# Patient Record
Sex: Male | Born: 1955 | Race: Black or African American | Hispanic: No | Marital: Single | Smoking: Current every day smoker
Health system: Southern US, Community
[De-identification: ages and names within clinical notes are randomized; demographics above are authoritative.]

## PROBLEM LIST (undated history)

## (undated) DIAGNOSIS — I2510 Atherosclerotic heart disease of native coronary artery without angina pectoris: Secondary | ICD-10-CM

---

## 2001-10-23 ENCOUNTER — Emergency Department (HOSPITAL_COMMUNITY): Admission: EM | Admit: 2001-10-23 | Discharge: 2001-10-23 | Payer: Self-pay | Admitting: Emergency Medicine

## 2001-10-24 ENCOUNTER — Encounter: Payer: Self-pay | Admitting: Emergency Medicine

## 2009-04-11 ENCOUNTER — Encounter: Admission: RE | Admit: 2009-04-11 | Discharge: 2009-04-11 | Payer: Self-pay | Admitting: Neurosurgery

## 2009-06-07 ENCOUNTER — Ambulatory Visit (HOSPITAL_COMMUNITY): Admission: RE | Admit: 2009-06-07 | Discharge: 2009-06-07 | Payer: Self-pay | Admitting: Neurosurgery

## 2009-07-30 ENCOUNTER — Encounter: Admission: RE | Admit: 2009-07-30 | Discharge: 2009-07-30 | Payer: Self-pay | Admitting: Neurosurgery

## 2009-10-11 ENCOUNTER — Ambulatory Visit (HOSPITAL_COMMUNITY): Admission: RE | Admit: 2009-10-11 | Discharge: 2009-10-11 | Payer: Self-pay | Admitting: Neurosurgery

## 2009-12-28 ENCOUNTER — Encounter
Admission: RE | Admit: 2009-12-28 | Discharge: 2010-01-03 | Payer: Self-pay | Source: Home / Self Care | Attending: Neurosurgery | Admitting: Neurosurgery

## 2010-01-03 ENCOUNTER — Encounter
Admission: RE | Admit: 2010-01-03 | Discharge: 2010-01-17 | Payer: Self-pay | Source: Home / Self Care | Attending: Neurosurgery | Admitting: Neurosurgery

## 2010-01-04 ENCOUNTER — Ambulatory Visit (HOSPITAL_COMMUNITY)
Admission: RE | Admit: 2010-01-04 | Discharge: 2010-01-04 | Payer: Self-pay | Source: Home / Self Care | Attending: Neurosurgery | Admitting: Neurosurgery

## 2010-01-17 ENCOUNTER — Encounter: Admit: 2010-01-17 | Payer: Self-pay | Admitting: Neurosurgery

## 2010-01-23 ENCOUNTER — Encounter: Payer: Self-pay | Admitting: Neurosurgery

## 2010-03-11 ENCOUNTER — Emergency Department (HOSPITAL_COMMUNITY): Payer: Self-pay

## 2010-03-11 ENCOUNTER — Emergency Department (HOSPITAL_COMMUNITY)
Admission: EM | Admit: 2010-03-11 | Discharge: 2010-03-11 | Disposition: A | Discharge status: Home / Self Care | Payer: Self-pay | Attending: Emergency Medicine | Admitting: Emergency Medicine

## 2010-03-11 DIAGNOSIS — Z7982 Long term (current) use of aspirin: Secondary | ICD-10-CM | POA: Insufficient documentation

## 2010-03-11 DIAGNOSIS — R1032 Left lower quadrant pain: Secondary | ICD-10-CM | POA: Insufficient documentation

## 2010-03-11 DIAGNOSIS — IMO0002 Reserved for concepts with insufficient information to code with codable children: Secondary | ICD-10-CM | POA: Insufficient documentation

## 2010-03-11 DIAGNOSIS — X58XXXA Exposure to other specified factors, initial encounter: Secondary | ICD-10-CM | POA: Insufficient documentation

## 2010-03-11 DIAGNOSIS — I10 Essential (primary) hypertension: Secondary | ICD-10-CM | POA: Insufficient documentation

## 2010-03-11 DIAGNOSIS — R071 Chest pain on breathing: Secondary | ICD-10-CM | POA: Insufficient documentation

## 2010-03-11 DIAGNOSIS — E7800 Pure hypercholesterolemia, unspecified: Secondary | ICD-10-CM | POA: Insufficient documentation

## 2010-03-11 DIAGNOSIS — I252 Old myocardial infarction: Secondary | ICD-10-CM | POA: Insufficient documentation

## 2010-03-11 DIAGNOSIS — K219 Gastro-esophageal reflux disease without esophagitis: Secondary | ICD-10-CM | POA: Insufficient documentation

## 2010-03-11 DIAGNOSIS — I2510 Atherosclerotic heart disease of native coronary artery without angina pectoris: Secondary | ICD-10-CM | POA: Insufficient documentation

## 2010-03-11 DIAGNOSIS — Z79899 Other long term (current) drug therapy: Secondary | ICD-10-CM | POA: Insufficient documentation

## 2010-03-11 LAB — COMPREHENSIVE METABOLIC PANEL
ALT: 11 U/L (ref 0–53)
AST: 13 U/L (ref 0–37)
BUN: 14 mg/dL (ref 6–23)
Chloride: 103 mEq/L (ref 96–112)
GFR calc Af Amer: >60 mL/min (ref >60)
Glucose, Bld: 119 mg/dL — ABNORMAL HIGH (ref 70–99)
Sodium: 136 mEq/L (ref 135–145)
Total Bilirubin: 0.6 mg/dL (ref 0.3–1.2)

## 2010-03-11 LAB — CBC
HCT: 39.6 % (ref 39.0–52.0)
Hemoglobin: 14 g/dL (ref 13.0–17.0)
MCV: 85.7 fL (ref 78.0–100.0)
Platelets: 267 10*3/uL (ref 150–400)
RBC: 4.62 MIL/uL (ref 4.22–5.81)
RDW: 13.6 % (ref 11.5–15.5)

## 2010-03-11 LAB — URINALYSIS, ROUTINE W REFLEX MICROSCOPIC
Hgb urine dipstick: NEGATIVE
Protein, ur: NEGATIVE mg/dL
Urobilinogen, UA: 1 mg/dL (ref 0.0–1.0)

## 2010-03-11 LAB — DIFFERENTIAL
Basophils Relative: 1 % (ref 0–1)
Eosinophils Relative: 3 % (ref 0–5)
Lymphocytes Relative: 47 % — ABNORMAL HIGH (ref 12–46)
Monocytes Absolute: 0.7 10*3/uL (ref 0.1–1.0)

## 2010-03-11 LAB — POCT CARDIAC MARKERS: Myoglobin, poc: 33.5 ng/mL (ref 12–200)

## 2010-03-11 LAB — PROTIME-INR: Prothrombin Time: 13.4 seconds (ref 11.6–15.2)

## 2010-03-21 LAB — TYPE AND SCREEN: ABO/RH(D): O POS

## 2010-03-21 LAB — DIFFERENTIAL
Basophils Absolute: 0 10*3/uL (ref 0.0–0.1)
Eosinophils Relative: 2 % (ref 0–5)
Lymphs Abs: 2.9 10*3/uL (ref 0.7–4.0)
Monocytes Absolute: 0.6 10*3/uL (ref 0.1–1.0)
Neutro Abs: 4 10*3/uL (ref 1.7–7.7)
Neutrophils Relative %: 52 % (ref 43–77)

## 2010-03-21 LAB — CBC
HCT: 40.8 % (ref 39.0–52.0)
Hemoglobin: 13.9 g/dL (ref 13.0–17.0)
MCHC: 34 g/dL (ref 30.0–36.0)
RBC: 4.41 MIL/uL (ref 4.22–5.81)
WBC: 7.6 10*3/uL (ref 4.0–10.5)

## 2010-03-21 LAB — ABO/RH: ABO/RH(D): O POS

## 2010-03-21 LAB — BASIC METABOLIC PANEL
CO2: 27 mEq/L (ref 19–32)
Calcium: 9.8 mg/dL (ref 8.4–10.5)
Chloride: 104 mEq/L (ref 96–112)
Creatinine, Ser: 0.97 mg/dL (ref 0.4–1.5)
Potassium: 4.8 mEq/L (ref 3.5–5.1)
Sodium: 136 mEq/L (ref 135–145)

## 2010-03-21 LAB — SURGICAL PCR SCREEN: MRSA, PCR: NEGATIVE

## 2012-12-17 ENCOUNTER — Emergency Department (HOSPITAL_COMMUNITY): Payer: Self-pay

## 2012-12-17 ENCOUNTER — Emergency Department (HOSPITAL_COMMUNITY)
Admission: EM | Admit: 2012-12-17 | Discharge: 2012-12-17 | Disposition: A | Discharge status: Home / Self Care | Payer: Self-pay | Attending: Emergency Medicine | Admitting: Emergency Medicine

## 2012-12-17 ENCOUNTER — Encounter (HOSPITAL_COMMUNITY): Payer: Self-pay | Admitting: Emergency Medicine

## 2012-12-17 DIAGNOSIS — Z79899 Other long term (current) drug therapy: Secondary | ICD-10-CM | POA: Insufficient documentation

## 2012-12-17 DIAGNOSIS — I252 Old myocardial infarction: Secondary | ICD-10-CM | POA: Insufficient documentation

## 2012-12-17 DIAGNOSIS — F17200 Nicotine dependence, unspecified, uncomplicated: Secondary | ICD-10-CM | POA: Insufficient documentation

## 2012-12-17 DIAGNOSIS — I10 Essential (primary) hypertension: Secondary | ICD-10-CM | POA: Insufficient documentation

## 2012-12-17 DIAGNOSIS — Z7982 Long term (current) use of aspirin: Secondary | ICD-10-CM | POA: Insufficient documentation

## 2012-12-17 DIAGNOSIS — R9431 Abnormal electrocardiogram [ECG] [EKG]: Secondary | ICD-10-CM | POA: Insufficient documentation

## 2012-12-17 DIAGNOSIS — I2510 Atherosclerotic heart disease of native coronary artery without angina pectoris: Secondary | ICD-10-CM | POA: Insufficient documentation

## 2012-12-17 DIAGNOSIS — Z7902 Long term (current) use of antithrombotics/antiplatelets: Secondary | ICD-10-CM | POA: Insufficient documentation

## 2012-12-17 DIAGNOSIS — E785 Hyperlipidemia, unspecified: Secondary | ICD-10-CM | POA: Insufficient documentation

## 2012-12-17 DIAGNOSIS — R079 Chest pain, unspecified: Secondary | ICD-10-CM | POA: Insufficient documentation

## 2012-12-17 HISTORY — DX: Atherosclerotic heart disease of native coronary artery without angina pectoris: I25.10

## 2012-12-17 LAB — POCT I-STAT TROPONIN I

## 2012-12-17 LAB — BASIC METABOLIC PANEL
Calcium: 9.8 mg/dL (ref 8.4–10.5)
Creatinine, Ser: 0.87 mg/dL (ref 0.50–1.35)
GFR calc Af Amer: >90 mL/min (ref >90)
GFR calc non Af Amer: >90 mL/min (ref >90)
Potassium: 4.4 mEq/L (ref 3.5–5.1)

## 2012-12-17 LAB — CBC
MCHC: 35.3 g/dL (ref 30.0–36.0)
MCV: 89.4 fL (ref 78.0–100.0)
RBC: 4.72 MIL/uL (ref 4.22–5.81)
WBC: 9.1 10*3/uL (ref 4.0–10.5)

## 2012-12-17 MED ORDER — KETOROLAC TROMETHAMINE 30 MG/ML IJ SOLN
60.0000 mg | Freq: Once | INTRAMUSCULAR | Status: AC
  Administered 2012-12-17: 60 mg via INTRAMUSCULAR
  Filled 2012-12-17: qty 2

## 2012-12-17 MED ORDER — GI COCKTAIL ~~LOC~~
30.0000 mL | Freq: Once | ORAL | Status: AC
  Administered 2012-12-17: 30 mL via ORAL
  Filled 2012-12-17: qty 30

## 2012-12-17 MED ORDER — KETOROLAC TROMETHAMINE 30 MG/ML IJ SOLN: 30.0000 mg | Freq: Once | INTRAMUSCULAR | Status: DC

## 2012-12-17 NOTE — ED Provider Notes (Signed)
CSN: [id number]     Arrival date & time 12/17/12  1348 History   First MD Initiated Contact with Patient 12/17/12 1725     Chief Complaint  Patient presents with  . Chest Pain   (Consider location/radiation/quality/duration/timing/severity/associated sxs/prior Treatment) HPI Anthony Newman is a 57 y.o. male who presents to the emergency department for concern of chest pain.  Patient reports that this has been going on for a month.  Patient describes it as sharp.  Located near L shoulder.  No radiation.  Worse with movement of arm.  Better with nothing.  2/10 in severity.  Nonexertional.  Patient has history of MI and reports that this feels nothing like his previous MI.  No SOB.  No other symptoms.  Past Medical History  Diagnosis Date  . Coronary artery disease    History reviewed. No pertinent past surgical history. History reviewed. No pertinent family history. History  Substance Use Topics  . Smoking status: Current Every Day Smoker  . Smokeless tobacco: Not on file  . Alcohol Use: No    Review of Systems  Constitutional: Negative for fever and chills.  HENT: Negative for congestion and sore throat.   Respiratory: Negative for cough.   Cardiovascular: Positive for chest pain.  Gastrointestinal: Negative for nausea, vomiting, abdominal pain, diarrhea and constipation.  Endocrine: Negative for polyuria.  Genitourinary: Negative for dysuria and hematuria.  Musculoskeletal: Negative for neck pain.  Skin: Negative for rash.  Neurological: Negative for headaches.  Psychiatric/Behavioral: Negative.   All other systems reviewed and are negative.    Allergies  Review of patient's allergies indicates no known allergies.  Home Medications   Current Outpatient Rx  Name  Route  Sig  Dispense  Refill  . aspirin EC 325 MG tablet   Oral   Take 325 mg by mouth daily.         Marland Kitchen atorvastatin (LIPITOR) 40 MG tablet   Oral   Take 40 mg by mouth daily.         . carvedilol  (COREG) 6.25 MG tablet   Oral   Take 6.25 mg by mouth 2 (two) times daily with a meal.         . clopidogrel (PLAVIX) 75 MG tablet   Oral   Take 75 mg by mouth every evening.         . dicyclomine (BENTYL) 10 MG capsule   Oral   Take 10 mg by mouth 3 (three) times daily as needed for spasms.         Marland Kitchen gabapentin (NEURONTIN) 300 MG capsule   Oral   Take 300 mg by mouth 3 (three) times daily.         Marland Kitchen HYDROcodone-acetaminophen (NORCO) 10-325 MG per tablet   Oral   Take 0.5-1 tablets by mouth every 6 (six) hours as needed.         . nitroGLYCERIN (NITROSTAT) 0.4 MG SL tablet   Sublingual   Place 0.4 mg under the tongue every 5 (five) minutes as needed for chest pain.         Marland Kitchen omeprazole (PRILOSEC) 40 MG capsule   Oral   Take 40 mg by mouth daily.         . quinapril (ACCUPRIL) 5 MG tablet   Oral   Take 5 mg by mouth daily.         . ranitidine (ZANTAC) 150 MG tablet   Oral   Take 150 mg by mouth 2 (  two) times daily.         . sertraline (ZOLOFT) 50 MG tablet   Oral   Take 50 mg by mouth daily.         Marland Kitchen spironolactone (ALDACTONE) 25 MG tablet   Oral   Take 12.5 mg by mouth daily.          BP 142/84  Pulse 61  Temp(Src) 98.5 F (36.9 C) (Oral)  Resp 10  Ht 5\' 8"  (1.727 m)  Wt 153 lb (69.4 kg)  BMI 23.27 kg/m2  SpO2 99% Physical Exam  Nursing note and vitals reviewed. Constitutional: He is oriented to person, place, and time. He appears well-developed and well-nourished. No distress.  HENT:  Head: Normocephalic and atraumatic.  Right Ear: External ear normal.  Left Ear: External ear normal.  Mouth/Throat: Oropharynx is clear and moist. No oropharyngeal exudate.  Eyes: Conjunctivae are normal. Pupils are equal, round, and reactive to light. Right eye exhibits no discharge.  Neck: Normal range of motion. Neck supple. No tracheal deviation present.  Cardiovascular: Normal rate, regular rhythm and intact distal pulses.   Pulmonary/Chest:  Effort normal. No respiratory distress. He has no wheezes. He has no rales. He exhibits bony tenderness (L sided).  Abdominal: Soft. He exhibits no distension. There is no tenderness. There is no rebound and no guarding.  Musculoskeletal: Normal range of motion.  Neurological: He is alert and oriented to person, place, and time.  Skin: Skin is warm and dry. No rash noted. He is not diaphoretic.  Psychiatric: He has a normal mood and affect.    ED Course  Procedures (including critical care time) Labs Review Labs Reviewed  BASIC METABOLIC PANEL - Abnormal; Notable for the following:    Glucose, Bld 101 (*)    All other components within normal limits  CBC  POCT I-STAT TROPONIN I   Imaging Review Dg Chest 2 View  12/17/2012   CLINICAL DATA:  Chest pain  EXAM: CHEST  2 VIEW  COMPARISON:  October 18, 2012  FINDINGS: There is no edema or consolidation. The heart size and pulmonary vascularity are normal. No adenopathy. No bone lesions. There is postoperative change in the lower cervical spine.  IMPRESSION: No edema or consolidation.   Electronically Signed   By: Bretta Bang M.D.   On: 12/17/2012 14:28    EKG Interpretation    Date/Time:  Tuesday December 17 2012 13:52:50 EST Ventricular Rate:  69 PR Interval:  178 QRS Duration: 82 QT Interval:  388 QTC Calculation: 415 R Axis:   70 Text Interpretation:  Normal sinus rhythm Septal infarct , age undetermined Abnormal ECG Confirmed by HARRISON  MD, FORREST ([id number]) on 12/17/2012 8:03:13 PM            MDM   1. Chest pain     Anthony Newman is a 57 y.o. male with history of HTN, HLD, and CAD who presents to the emergency department with 1 month of chest pain.  Exam very c/w MSK chest pain and reproducible exam.  Patient repeatedly states that this is not his anginal equivalent.  Doubt PE givern history.  No evidence of DVT on exam.  Pain treated with toradol and GI cocktail with complete resolution.  Recommend f/u with PCP  for repeat evaluation.  Patient discharged.    Arloa Koh, MD 12/17/12 [phone]

## 2012-12-17 NOTE — ED Notes (Signed)
Pt brought down by rapid response RN after visiting friend c/o left sided CP x months; pt sts took 2 SL nitro today

## 2012-12-18 NOTE — ED Provider Notes (Addendum)
Medical screening examination/treatment/procedure(s) were conducted as a shared visit with resident physician and myself.  I personally evaluated the patient during the encounter.   EKG Interpretation    Date/Time:  Tuesday December 17 2012 13:52:50 EST Ventricular Rate:  69 PR Interval:  178 QRS Duration: 82 QT Interval:  388 QTC Calculation: 415 R Axis:   70 Text Interpretation:  Normal sinus rhythm Septal infarct , age undetermined Abnormal ECG Confirmed by Lakeyia Surber  MD, Floretta Petro ([id number]) on 12/17/2012 8:03:13 PM            I interviewed and examined the patient. Lungs are CTAB. Cardiac exam wnl. Abdomen soft.  Pain reproducible on exam w/ palpation of the left shoulder. Pt has had this pain before. He does have a cardiac hx, but his pain is atypical. Neg troponin. Low rise for MACE per HEART score. Will rec close f/u w/ his pcp/cardiologist. Return for any worsening or return of pain.   Junius Argyle, MD 12/18/12 1313  Junius Argyle, MD 12/18/12 1314

## 2014-09-04 IMAGING — CR DG CHEST 2V
2 series · 2 of 2 positions shown · non-contrast
Comparison: October 18, 2012

CLINICAL DATA: Chest pain

EXAM:
CHEST  2 VIEW

[w chest pa]
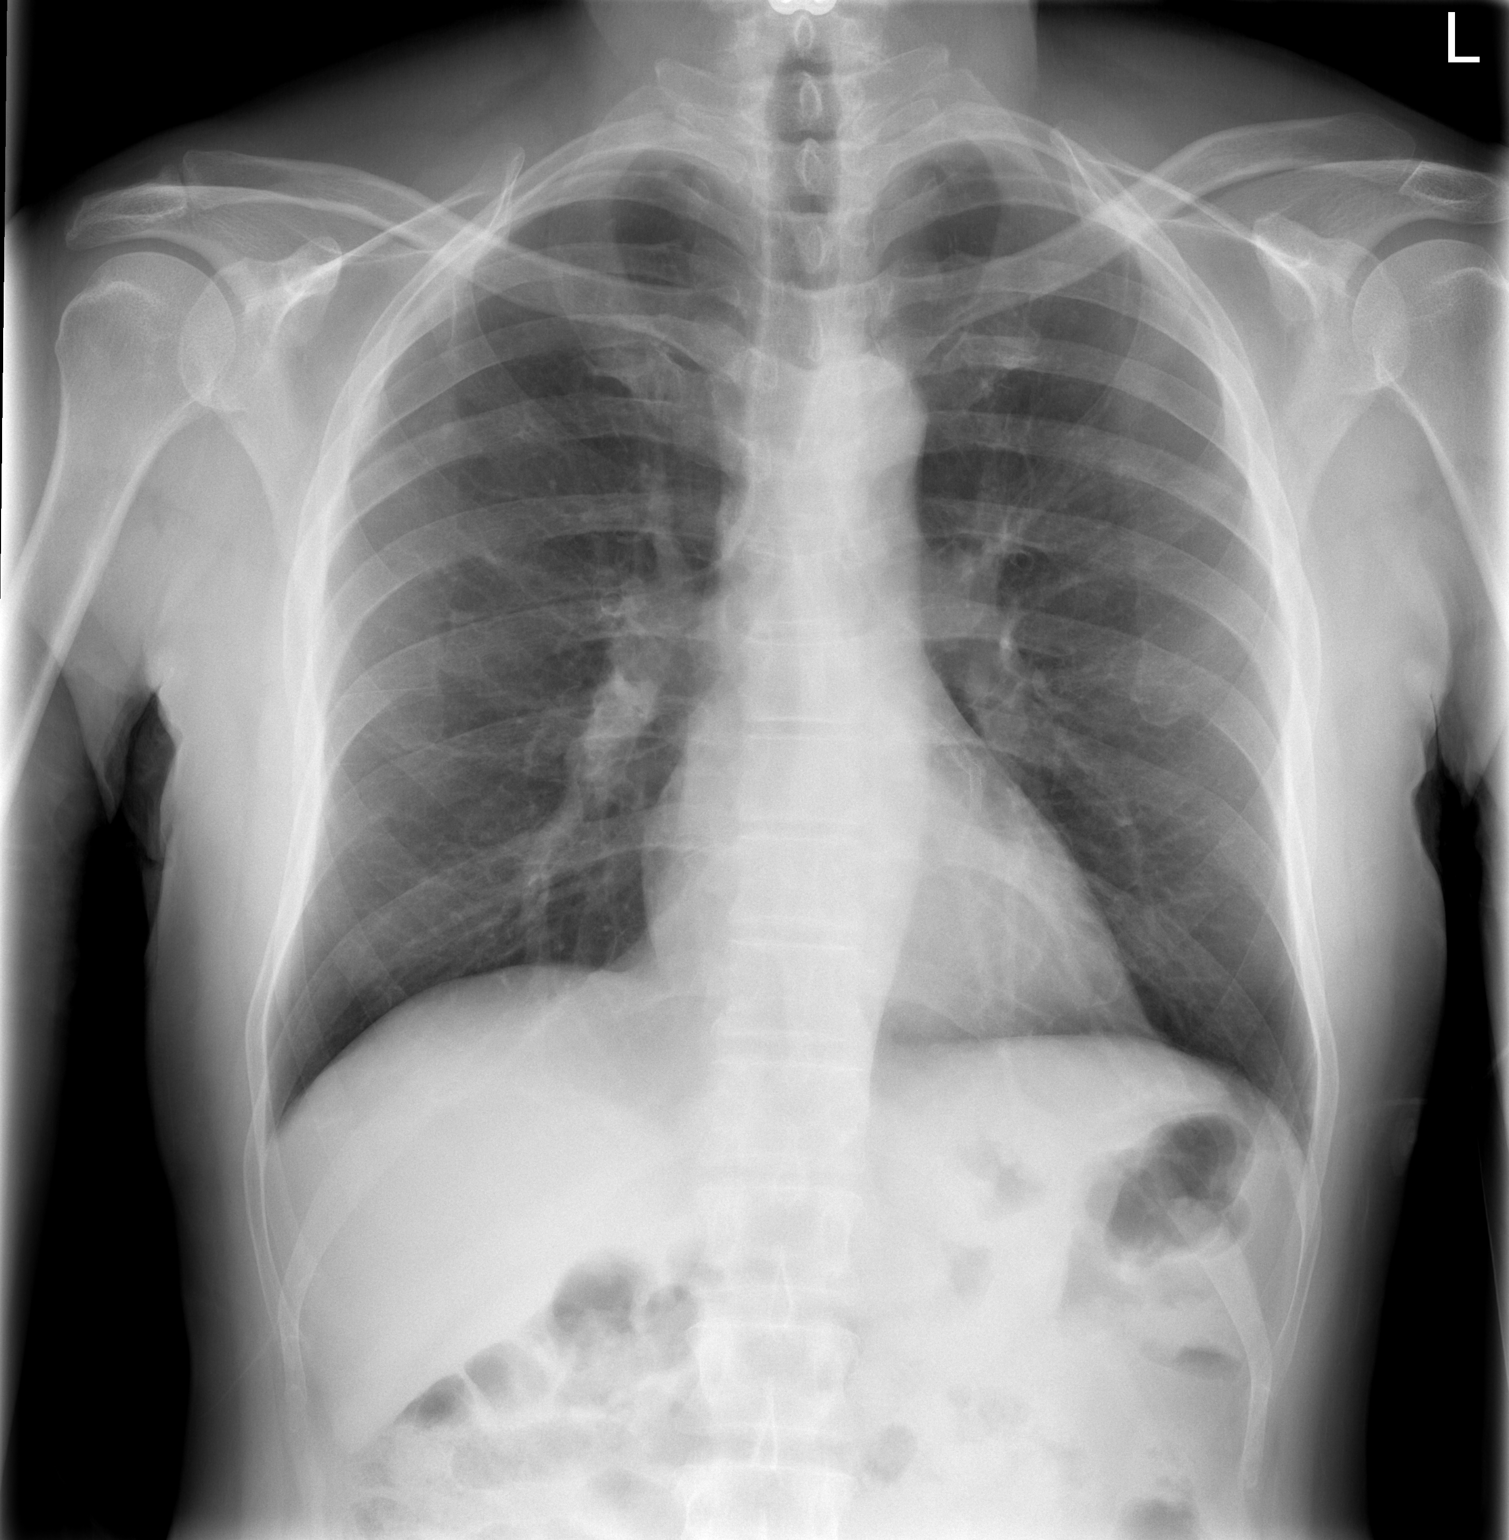

[w chest lat]
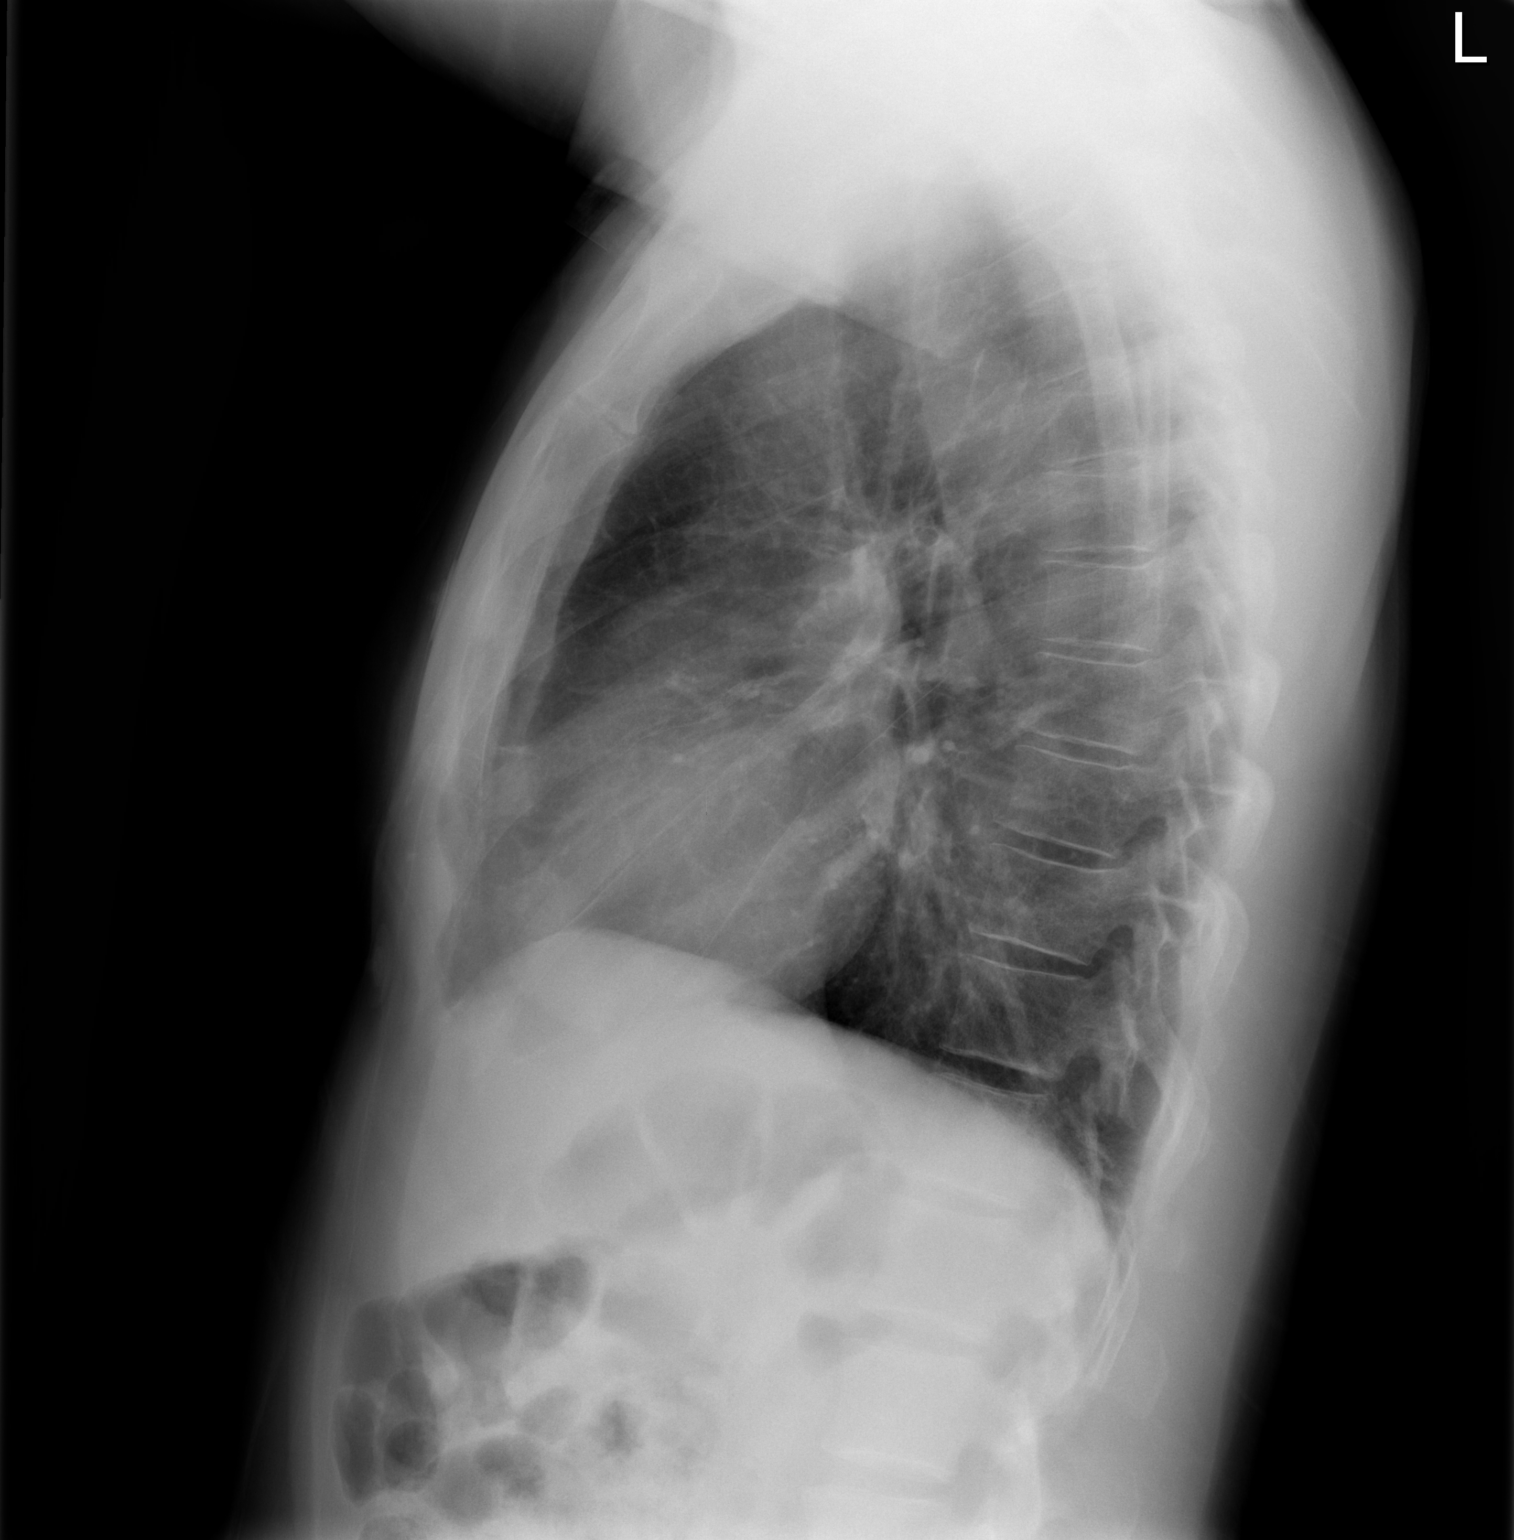

[2 of 2 positions shown; findings below may reference images not displayed]

FINDINGS: There is no edema or consolidation. The heart size and pulmonary
vascularity are normal. No adenopathy. No bone lesions. There is
postoperative change in the lower cervical spine.
IMPRESSION: No edema or consolidation.

## 2018-03-28 MED FILL — CARVEDILOL 6.25 MG TABLET: 6.25 | 30 days supply | Qty: 60 | Fill #0

## 2018-03-28 MED FILL — CLOPIDOGREL 75 MG TABLET: 75 | 30 days supply | Qty: 30 | Fill #0

## 2018-03-28 MED FILL — ATORVASTATIN 80 MG TABLET: 80 | 30 days supply | Qty: 30 | Fill #0

## 2018-03-28 MED FILL — MONTELUKAST SOD 10 MG TAB: 10 | 30 days supply | Qty: 30 | Fill #0

## 2018-03-28 MED FILL — SERTRALINE HCL 50 MG TABLET: 50 | 30 days supply | Qty: 30 | Fill #0

## 2018-03-28 MED FILL — GABAPENTIN 300 MG CAPSULE: 300 | 30 days supply | Qty: 90 | Fill #0

## 2018-03-28 MED FILL — SPIRONOLACTONE 25 MG TABS: 25 | 30 days supply | Qty: 15 | Fill #0

## 2018-03-28 MED FILL — QUINAPRIL 5 MG TABLET: 5 | 30 days supply | Qty: 30 | Fill #0

## 2018-04-09 MED FILL — HYDROCODON-APAP 10-325: 10-325 | 30 days supply | Qty: 60 | Fill #0

## 2018-04-10 MED FILL — ALBUTEROL SULFATE HFA 108 (: 108 (90 BAS | 25 days supply | Qty: 18 | Fill #0

## 2018-04-30 MED FILL — BACLOFEN 10 MG TABS: 10 | 20 days supply | Qty: 60 | Fill #0

## 2018-04-30 MED FILL — SUCRALFATE 1 GM TABLET: 1 | 40 days supply | Qty: 120 | Fill #0

## 2018-05-02 MED FILL — GI COCKTAIL (W/O BELLADO): 4 days supply | Qty: 480 | Fill #0

## 2018-05-04 MED FILL — ATORVASTATIN 80 MG TABLET: 80 | 30 days supply | Qty: 30 | Fill #1

## 2018-05-04 MED FILL — QUINAPRIL 5 MG TABLET: 5 | 30 days supply | Qty: 30 | Fill #1

## 2018-05-04 MED FILL — GABAPENTIN 300 MG CAPSULE: 300 | 30 days supply | Qty: 90 | Fill #1

## 2018-05-04 MED FILL — SERTRALINE HCL 50 MG TABLET: 50 | 30 days supply | Qty: 30 | Fill #1

## 2018-05-04 MED FILL — SPIRONOLACTONE 25 MG TABS: 25 | 30 days supply | Qty: 15 | Fill #1

## 2018-05-04 MED FILL — CARVEDILOL 6.25 MG TABLET: 6.25 | 30 days supply | Qty: 60 | Fill #1

## 2018-05-04 MED FILL — MONTELUKAST SOD 10 MG TAB: 10 | 30 days supply | Qty: 30 | Fill #1

## 2018-05-04 MED FILL — CLOPIDOGREL 75 MG TABLET: 75 | 30 days supply | Qty: 30 | Fill #1

## 2018-05-06 MED FILL — HYDROCODON-APAP 10-325: 10-325 | 30 days supply | Qty: 60 | Fill #0

## 2018-06-06 MED FILL — SERTRALINE HCL 50 MG TABS: 50 | 30 days supply | Qty: 30 | Fill #2

## 2018-06-06 MED FILL — HYDROCODON-APAP 10-325: 10-325 | 30 days supply | Qty: 60 | Fill #0

## 2018-06-06 MED FILL — QUINAPRIL 5 MG TABLET: 5 | 30 days supply | Qty: 30 | Fill #2

## 2018-06-06 MED FILL — CARVEDILOL 6.25 MG TABLET: 6.25 | 30 days supply | Qty: 60 | Fill #2

## 2018-06-06 MED FILL — CLOPIDOGREL 75 MG TABLET: 75 | 30 days supply | Qty: 30 | Fill #2

## 2018-06-06 MED FILL — ATORVASTATIN 80 MG TABLET: 80 | 30 days supply | Qty: 30 | Fill #2

## 2018-06-06 MED FILL — SPIRONOLACTONE 25 MG TABS: 25 | 30 days supply | Qty: 15 | Fill #2

## 2018-06-06 MED FILL — MONTELUKAST SOD 10 MG TAB: 10 | 30 days supply | Qty: 30 | Fill #2

## 2018-06-06 MED FILL — GABAPENTIN 300 MG CAPSULE: 300 | 30 days supply | Qty: 90 | Fill #2

## 2018-06-07 MED FILL — CEFDINIR 300 MG CAPSULE: 300 | 7 days supply | Qty: 14 | Fill #0

## 2018-06-08 MED FILL — GI COCKTAIL (W/O BELLADO): 4 days supply | Qty: 480 | Fill #0

## 2018-06-11 MED FILL — ALBUTEROL SULFATE HFA 108 (: 108 (90 BAS | 25 days supply | Qty: 18 | Fill #0

## 2018-06-25 MED FILL — BACLOFEN 10 MG TABS: 10 | 20 days supply | Qty: 60 | Fill #0

## 2018-07-08 MED FILL — HYDROCODON-APAP 10-325: 10-325 | 30 days supply | Qty: 60 | Fill #0

## 2019-03-27 ENCOUNTER — Other Ambulatory Visit (HOSPITAL_COMMUNITY): Payer: Self-pay | Admitting: Internal Medicine

## 2019-06-05 ENCOUNTER — Emergency Department (HOSPITAL_COMMUNITY): Payer: Medicare Other | Admitting: Certified Registered"

## 2019-06-05 ENCOUNTER — Encounter (HOSPITAL_COMMUNITY): Payer: Self-pay

## 2019-06-05 ENCOUNTER — Encounter (HOSPITAL_COMMUNITY): Admission: EM | Disposition: A | Discharge status: Home / Self Care | Payer: Self-pay | Source: Home / Self Care

## 2019-06-05 ENCOUNTER — Other Ambulatory Visit: Payer: Self-pay

## 2019-06-05 ENCOUNTER — Inpatient Hospital Stay (HOSPITAL_COMMUNITY)
Admission: EM | Admit: 2019-06-05 | Discharge: 2019-06-06 | DRG: 343 | Disposition: A | Discharge status: Home / Self Care | Payer: Medicare Other | Attending: Surgery | Admitting: Surgery

## 2019-06-05 DIAGNOSIS — F17200 Nicotine dependence, unspecified, uncomplicated: Secondary | ICD-10-CM | POA: Diagnosis not present

## 2019-06-05 DIAGNOSIS — K219 Gastro-esophageal reflux disease without esophagitis: Secondary | ICD-10-CM | POA: Diagnosis not present

## 2019-06-05 DIAGNOSIS — Z79899 Other long term (current) drug therapy: Secondary | ICD-10-CM

## 2019-06-05 DIAGNOSIS — I10 Essential (primary) hypertension: Secondary | ICD-10-CM | POA: Diagnosis present

## 2019-06-05 DIAGNOSIS — I2510 Atherosclerotic heart disease of native coronary artery without angina pectoris: Secondary | ICD-10-CM | POA: Diagnosis present

## 2019-06-05 DIAGNOSIS — I252 Old myocardial infarction: Secondary | ICD-10-CM | POA: Diagnosis not present

## 2019-06-05 DIAGNOSIS — Z7982 Long term (current) use of aspirin: Secondary | ICD-10-CM | POA: Diagnosis not present

## 2019-06-05 DIAGNOSIS — Z20822 Contact with and (suspected) exposure to covid-19: Secondary | ICD-10-CM | POA: Diagnosis present

## 2019-06-05 DIAGNOSIS — Z7902 Long term (current) use of antithrombotics/antiplatelets: Secondary | ICD-10-CM | POA: Diagnosis not present

## 2019-06-05 DIAGNOSIS — E785 Hyperlipidemia, unspecified: Secondary | ICD-10-CM | POA: Diagnosis present

## 2019-06-05 DIAGNOSIS — Z955 Presence of coronary angioplasty implant and graft: Secondary | ICD-10-CM | POA: Diagnosis not present

## 2019-06-05 DIAGNOSIS — K3580 Unspecified acute appendicitis: Secondary | ICD-10-CM | POA: Diagnosis present

## 2019-06-05 DIAGNOSIS — K37 Unspecified appendicitis: Secondary | ICD-10-CM | POA: Diagnosis present

## 2019-06-05 DIAGNOSIS — R1031 Right lower quadrant pain: Secondary | ICD-10-CM | POA: Diagnosis present

## 2019-06-05 DIAGNOSIS — K353 Acute appendicitis with localized peritonitis, without perforation or gangrene: Secondary | ICD-10-CM | POA: Diagnosis not present

## 2019-06-05 HISTORY — PX: LAPAROSCOPIC APPENDECTOMY: SHX408

## 2019-06-05 LAB — CBC WITH DIFFERENTIAL/PLATELET
Abs Immature Granulocytes: 0.07 10*3/uL (ref 0.00–0.07)
Basophils Absolute: 0 10*3/uL (ref 0.0–0.1)
Basophils Relative: 0 %
Eosinophils Absolute: 0 10*3/uL (ref 0.0–0.5)
Eosinophils Relative: 0 %
HCT: 42.3 % (ref 39.0–52.0)
Hemoglobin: 14.3 g/dL (ref 13.0–17.0)
Immature Granulocytes: 1 %
Lymphocytes Relative: 10 %
Lymphs Abs: 1.5 10*3/uL (ref 0.7–4.0)
MCH: 30.6 pg (ref 26.0–34.0)
MCHC: 33.8 g/dL (ref 30.0–36.0)
MCV: 90.4 fL (ref 80.0–100.0)
Monocytes Absolute: 0.7 10*3/uL (ref 0.1–1.0)
Monocytes Relative: 4 %
Neutro Abs: 13 10*3/uL — ABNORMAL HIGH (ref 1.7–7.7)
Neutrophils Relative %: 85 %
Platelets: 255 10*3/uL (ref 150–400)
RBC: 4.68 MIL/uL (ref 4.22–5.81)
RDW: 14.6 % (ref 11.5–15.5)
WBC: 15.2 10*3/uL — ABNORMAL HIGH (ref 4.0–10.5)
nRBC: 0 % (ref 0.0–0.2)

## 2019-06-05 LAB — COMPREHENSIVE METABOLIC PANEL
ALT: 20 U/L (ref 0–44)
AST: 19 U/L (ref 15–41)
Albumin: 4.2 g/dL (ref 3.5–5.0)
Alkaline Phosphatase: 89 U/L (ref 38–126)
Anion gap: 15 (ref 5–15)
BUN: 9 mg/dL (ref 8–23)
CO2: 22 mmol/L (ref 22–32)
Calcium: 9.5 mg/dL (ref 8.9–10.3)
Chloride: 99 mmol/L (ref 98–111)
Creatinine, Ser: 0.85 mg/dL (ref 0.61–1.24)
GFR calc Af Amer: >60 mL/min (ref >60)
GFR calc non Af Amer: >60 mL/min (ref >60)
Glucose, Bld: 129 mg/dL — ABNORMAL HIGH (ref 70–99)
Potassium: 4.1 mmol/L (ref 3.5–5.1)
Sodium: 136 mmol/L (ref 135–145)
Total Bilirubin: 0.9 mg/dL (ref 0.3–1.2)
Total Protein: 7.4 g/dL (ref 6.5–8.1)

## 2019-06-05 LAB — SARS CORONAVIRUS 2 BY RT PCR (HOSPITAL ORDER, PERFORMED IN ~~LOC~~ HOSPITAL LAB): SARS Coronavirus 2: NEGATIVE

## 2019-06-05 LAB — CBC
HCT: 39.8 % (ref 39.0–52.0)
Hemoglobin: 13.6 g/dL (ref 13.0–17.0)
MCH: 30.6 pg (ref 26.0–34.0)
MCHC: 34.2 g/dL (ref 30.0–36.0)
MCV: 89.4 fL (ref 80.0–100.0)
Platelets: 228 10*3/uL (ref 150–400)
RBC: 4.45 MIL/uL (ref 4.22–5.81)
RDW: 14.5 % (ref 11.5–15.5)
WBC: 12.7 10*3/uL — ABNORMAL HIGH (ref 4.0–10.5)
nRBC: 0 % (ref 0.0–0.2)

## 2019-06-05 LAB — LIPASE, BLOOD: Lipase: 24 U/L (ref 11–51)

## 2019-06-05 SURGERY — APPENDECTOMY, LAPAROSCOPIC
Anesthesia: General

## 2019-06-05 MED ORDER — ONDANSETRON HCL 4 MG/2ML IJ SOLN: 4.0000 mg | Freq: Four times a day (QID) | INTRAMUSCULAR | Status: DC | PRN

## 2019-06-05 MED ORDER — ATORVASTATIN CALCIUM 40 MG PO TABS
40.0000 mg | ORAL_TABLET | Freq: Every day | ORAL | Status: DC
  Administered 2019-06-06: 40 mg via ORAL
  Filled 2019-06-05: qty 1

## 2019-06-05 MED ORDER — SODIUM CHLORIDE (PF) 0.9 % IJ SOLN
INTRAMUSCULAR | Status: AC
  Filled 2019-06-05: qty 10

## 2019-06-05 MED ORDER — CARVEDILOL 6.25 MG PO TABS
6.2500 mg | ORAL_TABLET | Freq: Two times a day (BID) | ORAL | Status: DC
  Administered 2019-06-06: 6.25 mg via ORAL
  Filled 2019-06-05: qty 1

## 2019-06-05 MED ORDER — NITROGLYCERIN 0.4 MG SL SUBL: 0.4000 mg | SUBLINGUAL_TABLET | SUBLINGUAL | Status: DC | PRN

## 2019-06-05 MED ORDER — PROPOFOL 10 MG/ML IV BOLUS
INTRAVENOUS | Status: AC
  Filled 2019-06-05: qty 20

## 2019-06-05 MED ORDER — ROCURONIUM BROMIDE 10 MG/ML (PF) SYRINGE
PREFILLED_SYRINGE | INTRAVENOUS | Status: AC
  Filled 2019-06-05: qty 10

## 2019-06-05 MED ORDER — ENOXAPARIN SODIUM 40 MG/0.4ML ~~LOC~~ SOLN
40.0000 mg | SUBCUTANEOUS | Status: DC
  Administered 2019-06-06: 40 mg via SUBCUTANEOUS
  Filled 2019-06-05: qty 0.4

## 2019-06-05 MED ORDER — MIDAZOLAM HCL 2 MG/2ML IJ SOLN
INTRAMUSCULAR | Status: AC
  Filled 2019-06-05: qty 2

## 2019-06-05 MED ORDER — PIPERACILLIN-TAZOBACTAM 3.375 G IVPB 30 MIN
3.3750 g | Freq: Once | INTRAVENOUS | Status: AC
  Administered 2019-06-05: 3.375 g via INTRAVENOUS
  Filled 2019-06-05: qty 50

## 2019-06-05 MED ORDER — SPIRONOLACTONE 12.5 MG HALF TABLET
12.5000 mg | ORAL_TABLET | Freq: Every day | ORAL | Status: DC
  Administered 2019-06-06: 12.5 mg via ORAL
  Filled 2019-06-05: qty 1

## 2019-06-05 MED ORDER — PROMETHAZINE HCL 25 MG/ML IJ SOLN: 6.2500 mg | INTRAMUSCULAR | Status: DC | PRN

## 2019-06-05 MED ORDER — MORPHINE SULFATE (PF) 4 MG/ML IV SOLN
4.0000 mg | Freq: Once | INTRAVENOUS | Status: AC
  Administered 2019-06-05: 4 mg via INTRAVENOUS
  Filled 2019-06-05: qty 1

## 2019-06-05 MED ORDER — DIPHENHYDRAMINE HCL 12.5 MG/5ML PO ELIX: 12.5000 mg | ORAL_SOLUTION | Freq: Four times a day (QID) | ORAL | Status: DC | PRN

## 2019-06-05 MED ORDER — OXYCODONE HCL 5 MG PO TABS
5.0000 mg | ORAL_TABLET | ORAL | Status: DC | PRN
  Administered 2019-06-06: 10 mg via ORAL
  Filled 2019-06-05: qty 2

## 2019-06-05 MED ORDER — GLYCOPYRROLATE 0.2 MG/ML IJ SOLN
INTRAMUSCULAR | Status: DC | PRN
  Administered 2019-06-05: .2 mg via INTRAVENOUS

## 2019-06-05 MED ORDER — DEXTROSE-NACL 5-0.45 % IV SOLN: INTRAVENOUS | Status: DC

## 2019-06-05 MED ORDER — SODIUM CHLORIDE 0.9 % IR SOLN
Status: DC | PRN
  Administered 2019-06-05: 1

## 2019-06-05 MED ORDER — LIDOCAINE HCL (CARDIAC) PF 100 MG/5ML IV SOSY
PREFILLED_SYRINGE | INTRAVENOUS | Status: DC | PRN
  Administered 2019-06-05: 60 mg via INTRATRACHEAL

## 2019-06-05 MED ORDER — SUGAMMADEX SODIUM 200 MG/2ML IV SOLN
INTRAVENOUS | Status: DC | PRN
  Administered 2019-06-05: 200 mg via INTRAVENOUS

## 2019-06-05 MED ORDER — SUFENTANIL CITRATE 50 MCG/ML IV SOLN
INTRAVENOUS | Status: DC | PRN
  Administered 2019-06-05 (×3): 10 ug via INTRAVENOUS

## 2019-06-05 MED ORDER — LACTATED RINGERS IV SOLN: INTRAVENOUS | Status: DC | PRN

## 2019-06-05 MED ORDER — DEXAMETHASONE SODIUM PHOSPHATE 10 MG/ML IJ SOLN
INTRAMUSCULAR | Status: AC
  Filled 2019-06-05: qty 1

## 2019-06-05 MED ORDER — MORPHINE SULFATE (PF) 2 MG/ML IV SOLN
2.0000 mg | INTRAVENOUS | Status: DC | PRN
  Administered 2019-06-06: 2 mg via INTRAVENOUS
  Filled 2019-06-05: qty 1

## 2019-06-05 MED ORDER — HYDROMORPHONE HCL 1 MG/ML IJ SOLN: 0.2500 mg | INTRAMUSCULAR | Status: DC | PRN

## 2019-06-05 MED ORDER — GABAPENTIN 300 MG PO CAPS
300.0000 mg | ORAL_CAPSULE | Freq: Three times a day (TID) | ORAL | Status: DC
  Administered 2019-06-05 – 2019-06-06 (×2): 300 mg via ORAL
  Filled 2019-06-05 (×2): qty 1

## 2019-06-05 MED ORDER — ONDANSETRON 4 MG PO TBDP: 4.0000 mg | ORAL_TABLET | Freq: Four times a day (QID) | ORAL | Status: DC | PRN

## 2019-06-05 MED ORDER — DEXAMETHASONE SODIUM PHOSPHATE 10 MG/ML IJ SOLN
INTRAMUSCULAR | Status: DC | PRN
  Administered 2019-06-05: 10 mg via INTRAVENOUS

## 2019-06-05 MED ORDER — ONDANSETRON HCL 4 MG/2ML IJ SOLN
INTRAMUSCULAR | Status: DC | PRN
  Administered 2019-06-05: 4 mg via INTRAVENOUS

## 2019-06-05 MED ORDER — ROCURONIUM BROMIDE 100 MG/10ML IV SOLN
INTRAVENOUS | Status: DC | PRN
  Administered 2019-06-05: 50 mg via INTRAVENOUS

## 2019-06-05 MED ORDER — OXYCODONE HCL 5 MG/5ML PO SOLN: 5.0000 mg | Freq: Once | ORAL | Status: DC | PRN

## 2019-06-05 MED ORDER — SERTRALINE HCL 50 MG PO TABS
50.0000 mg | ORAL_TABLET | Freq: Every day | ORAL | Status: DC
  Administered 2019-06-06: 50 mg via ORAL
  Filled 2019-06-05: qty 1

## 2019-06-05 MED ORDER — PANTOPRAZOLE SODIUM 40 MG PO TBEC
40.0000 mg | DELAYED_RELEASE_TABLET | Freq: Every day | ORAL | Status: DC
  Administered 2019-06-06: 40 mg via ORAL
  Filled 2019-06-05: qty 1

## 2019-06-05 MED ORDER — SUCCINYLCHOLINE CHLORIDE 200 MG/10ML IV SOSY
PREFILLED_SYRINGE | INTRAVENOUS | Status: AC
  Filled 2019-06-05: qty 10

## 2019-06-05 MED ORDER — SUFENTANIL CITRATE 50 MCG/ML IV SOLN
INTRAVENOUS | Status: AC
  Filled 2019-06-05: qty 1

## 2019-06-05 MED ORDER — PNEUMOCOCCAL VAC POLYVALENT 25 MCG/0.5ML IJ INJ
0.5000 mL | INJECTION | INTRAMUSCULAR | Status: DC
  Filled 2019-06-05: qty 0.5

## 2019-06-05 MED ORDER — LIDOCAINE 2% (20 MG/ML) 5 ML SYRINGE
INTRAMUSCULAR | Status: AC
  Filled 2019-06-05: qty 5

## 2019-06-05 MED ORDER — BUPIVACAINE HCL (PF) 0.25 % IJ SOLN
INTRAMUSCULAR | Status: AC
  Filled 2019-06-05: qty 30

## 2019-06-05 MED ORDER — GLYCOPYRROLATE PF 0.2 MG/ML IJ SOSY
PREFILLED_SYRINGE | INTRAMUSCULAR | Status: AC
  Filled 2019-06-05: qty 1

## 2019-06-05 MED ORDER — DICYCLOMINE HCL 10 MG PO CAPS: 10.0000 mg | ORAL_CAPSULE | Freq: Three times a day (TID) | ORAL | Status: DC | PRN

## 2019-06-05 MED ORDER — BUPIVACAINE-EPINEPHRINE 0.25% -1:200000 IJ SOLN
INTRAMUSCULAR | Status: DC | PRN
  Administered 2019-06-05: 30 mL

## 2019-06-05 MED ORDER — ONDANSETRON HCL 4 MG/2ML IJ SOLN
4.0000 mg | Freq: Once | INTRAMUSCULAR | Status: AC
  Administered 2019-06-05: 4 mg via INTRAVENOUS
  Filled 2019-06-05: qty 2

## 2019-06-05 MED ORDER — ACETAMINOPHEN 10 MG/ML IV SOLN: 1000.0000 mg | Freq: Once | INTRAVENOUS | Status: DC | PRN

## 2019-06-05 MED ORDER — SODIUM CHLORIDE 0.9 % IV BOLUS
1000.0000 mL | Freq: Once | INTRAVENOUS | Status: AC
  Administered 2019-06-05: 1000 mL via INTRAVENOUS

## 2019-06-05 MED ORDER — 0.9 % SODIUM CHLORIDE (POUR BTL) OPTIME
TOPICAL | Status: DC | PRN
  Administered 2019-06-05: 1000 mL

## 2019-06-05 MED ORDER — DIPHENHYDRAMINE HCL 50 MG/ML IJ SOLN: 12.5000 mg | Freq: Four times a day (QID) | INTRAMUSCULAR | Status: DC | PRN

## 2019-06-05 MED ORDER — PROPOFOL 10 MG/ML IV BOLUS
INTRAVENOUS | Status: DC | PRN
  Administered 2019-06-05: 150 mg via INTRAVENOUS

## 2019-06-05 MED ORDER — MIDAZOLAM HCL 5 MG/5ML IJ SOLN
INTRAMUSCULAR | Status: DC | PRN
  Administered 2019-06-05: 2 mg via INTRAVENOUS

## 2019-06-05 MED ORDER — ONDANSETRON HCL 4 MG/2ML IJ SOLN
INTRAMUSCULAR | Status: AC
  Filled 2019-06-05: qty 2

## 2019-06-05 MED ORDER — OXYCODONE HCL 5 MG PO TABS: 5.0000 mg | ORAL_TABLET | Freq: Once | ORAL | Status: DC | PRN

## 2019-06-05 MED ORDER — SUCCINYLCHOLINE CHLORIDE 20 MG/ML IJ SOLN
INTRAMUSCULAR | Status: DC | PRN
  Administered 2019-06-05: 70 mg via INTRAVENOUS

## 2019-06-05 MED ORDER — METOPROLOL TARTRATE 5 MG/5ML IV SOLN: 5.0000 mg | Freq: Four times a day (QID) | INTRAVENOUS | Status: DC | PRN

## 2019-06-05 SURGICAL SUPPLY — 44 items
ADH SKN CLS APL DERMABOND .7 (GAUZE/BANDAGES/DRESSINGS) ×1
APL PRP STRL LF DISP 70% ISPRP (MISCELLANEOUS) ×1
APPLIER CLIP 5 13 M/L LIGAMAX5 (MISCELLANEOUS)
APR CLP MED LRG 5 ANG JAW (MISCELLANEOUS)
BAG SPEC RTRVL 10 TROC 200 (ENDOMECHANICALS) ×1
CANISTER SUCT 3000ML PPV (MISCELLANEOUS) ×3 IMPLANT
CHLORAPREP W/TINT 26 (MISCELLANEOUS) ×3 IMPLANT
CLIP APPLIE 5 13 M/L LIGAMAX5 (MISCELLANEOUS) IMPLANT
CLIP VESOLOCK XL 6/CT ×2 IMPLANT
CLIP VESOLOCK XL 6/CT (CLIP) ×1 IMPLANT
COVER SURGICAL LIGHT HANDLE (MISCELLANEOUS) ×3 IMPLANT
COVER WAND RF STERILE (DRAPES) ×1 IMPLANT
DERMABOND ADVANCED (GAUZE/BANDAGES/DRESSINGS) ×2
DERMABOND ADVANCED .7 DNX12 (GAUZE/BANDAGES/DRESSINGS) ×1 IMPLANT
ELECT REM PT RETURN 9FT ADLT (ELECTROSURGICAL) ×3
ELECTRODE REM PT RTRN 9FT ADLT (ELECTROSURGICAL) ×1 IMPLANT
ENDOLOOP SUT PDS II  0 18 (SUTURE)
ENDOLOOP SUT PDS II 0 18 (SUTURE) IMPLANT
GLOVE BIOGEL PI IND STRL 7.0 (GLOVE) ×1 IMPLANT
GLOVE BIOGEL PI INDICATOR 7.0 (GLOVE) ×2
GLOVE SURG SS PI 7.0 STRL IVOR (GLOVE) ×3 IMPLANT
GOWN STRL REUS W/ TWL LRG LVL3 (GOWN DISPOSABLE) ×3 IMPLANT
GOWN STRL REUS W/TWL LRG LVL3 (GOWN DISPOSABLE) ×9
GRASPER SUT TROCAR 14GX15 (MISCELLANEOUS) ×3 IMPLANT
KIT BASIN OR (CUSTOM PROCEDURE TRAY) ×3 IMPLANT
KIT TURNOVER KIT B (KITS) ×3 IMPLANT
NEEDLE 22X1 1/2 (OR ONLY) (NEEDLE) ×3 IMPLANT
NS IRRIG 1000ML POUR BTL (IV SOLUTION) ×3 IMPLANT
PAD ARMBOARD 7.5X6 YLW CONV (MISCELLANEOUS) ×6 IMPLANT
POUCH RETRIEVAL ECOSAC 10 (ENDOMECHANICALS) ×1 IMPLANT
POUCH RETRIEVAL ECOSAC 10MM (ENDOMECHANICALS) ×3
SCISSORS LAP 5X35 DISP (ENDOMECHANICALS) ×3 IMPLANT
SET IRRIG TUBING LAPAROSCOPIC (IRRIGATION / IRRIGATOR) ×3 IMPLANT
SET TUBE SMOKE EVAC HIGH FLOW (TUBING) ×3 IMPLANT
SLEEVE ENDOPATH XCEL 5M (ENDOMECHANICALS) ×3 IMPLANT
SPECIMEN JAR SMALL (MISCELLANEOUS) ×3 IMPLANT
SUT MNCRL AB 4-0 PS2 18 (SUTURE) ×3 IMPLANT
TOWEL GREEN STERILE (TOWEL DISPOSABLE) ×3 IMPLANT
TOWEL GREEN STERILE FF (TOWEL DISPOSABLE) ×3 IMPLANT
TRAY FOLEY W/BAG SLVR 14FR (SET/KITS/TRAYS/PACK) IMPLANT
TRAY LAPAROSCOPIC MC (CUSTOM PROCEDURE TRAY) ×3 IMPLANT
TROCAR XCEL 12X100 BLDLESS (ENDOMECHANICALS) ×3 IMPLANT
TROCAR XCEL NON-BLD 5MMX100MML (ENDOMECHANICALS) ×3 IMPLANT
WATER STERILE IRR 1000ML POUR (IV SOLUTION) ×3 IMPLANT

## 2019-06-05 NOTE — ED Provider Notes (Signed)
[facility] EMERGENCY DEPARTMENT Provider Note   CSN: [id number] Arrival date & time: 06/05/19  1703     History Chief Complaint  Patient presents with  . Abdominal Pain    Anthony Newman is a 64 y.o. male.  HPI      Anthony Newman is a 64 y.o. male, with a history of CAD, presenting to the ED with abdominal pain beginning this morning around 6:30 am this morning.  Pain lower abdomen at onset, now in the entire right side of abdomen, vague description, severe, radiating throughout rest of abdomen.  Nausea. One episode of vomiting. Last food was last night.  States he went to his doctor at [facility] today, a CT scan was performed, he was diagnosed with acute appendicitis, and told to come to the ED.  Denies fever/chills, diarrhea, hematochezia/melena, syncope, or any other complaints.   Past Medical History:  Diagnosis Date  . Coronary artery disease     There are no problems to display for this patient.   History reviewed. No pertinent surgical history.     History reviewed. No pertinent family history.  Social History   Tobacco Use  . Smoking status: Current Every Day Smoker  Substance Use Topics  . Alcohol use: No  . Drug use: No    Home Medications Prior to Admission medications   Medication Sig Start Date End Date Taking? Authorizing Provider  aspirin EC 325 MG tablet Take 325 mg by mouth daily.    [provider]  atorvastatin (LIPITOR) 40 MG tablet Take 40 mg by mouth daily.    [provider]  carvedilol (COREG) 6.25 MG tablet Take 6.25 mg by mouth 2 (two) times daily with a meal.    [provider]  clopidogrel (PLAVIX) 75 MG tablet Take 75 mg by mouth every evening.    [provider]  dicyclomine (BENTYL) 10 MG capsule Take 10 mg by mouth 3 (three) times daily as needed for spasms.    [provider]  gabapentin (NEURONTIN) 300 MG capsule Take 300 mg by mouth 3 (three) times  daily.    [provider]  HYDROcodone-acetaminophen (NORCO) 10-325 MG per tablet Take 0.5-1 tablets by mouth every 6 (six) hours as needed.    [provider]  nitroGLYCERIN (NITROSTAT) 0.4 MG SL tablet Place 0.4 mg under the tongue every 5 (five) minutes as needed for chest pain.    [provider]  omeprazole (PRILOSEC) 40 MG capsule Take 40 mg by mouth daily.    [provider]  quinapril (ACCUPRIL) 5 MG tablet Take 5 mg by mouth daily.    [provider]  ranitidine (ZANTAC) 150 MG tablet Take 150 mg by mouth 2 (two) times daily.    [provider]  sertraline (ZOLOFT) 50 MG tablet Take 50 mg by mouth daily.    [provider]  spironolactone (ALDACTONE) 25 MG tablet Take 12.5 mg by mouth daily.    [provider]    Allergies    Patient has no known allergies.  Review of Systems   Review of Systems  Constitutional: Negative for chills and fever.  Respiratory: Negative for shortness of breath.   Cardiovascular: Negative for chest pain.  Gastrointestinal: Positive for abdominal pain, nausea and vomiting. Negative for diarrhea.  Genitourinary: Negative for dysuria, flank pain, frequency and hematuria.  Musculoskeletal: Negative for back pain.  Neurological: Negative for syncope and weakness.  All other systems reviewed and  are negative.   Physical Exam Updated Vital Signs BP 127/88   Pulse 81   Temp 98.6 F (37 C) (Oral)   Resp 20   Ht 5\' 8"  (1.727 m)   Wt 68 kg   SpO2 98%   BMI 22.81 kg/m   Physical Exam Vitals and nursing note reviewed.  Constitutional:      General: He is not in acute distress.    Appearance: He is well-developed. He is not diaphoretic.  HENT:     Head: Normocephalic and atraumatic.     Mouth/Throat:     Mouth: Mucous membranes are moist.     Pharynx: Oropharynx is clear.  Eyes:     Conjunctiva/sclera: Conjunctivae normal.  Cardiovascular:     Rate and Rhythm: Normal rate  and regular rhythm.     Pulses: Normal pulses.          Radial pulses are 2+ on the right side and 2+ on the left side.  Pulmonary:     Effort: Pulmonary effort is normal. No respiratory distress.  Abdominal:     Palpations: Abdomen is soft.     Tenderness: There is abdominal tenderness. There is no guarding.    Musculoskeletal:     Cervical back: Neck supple.  Lymphadenopathy:     Cervical: No cervical adenopathy.  Skin:    General: Skin is warm and dry.  Neurological:     Mental Status: He is alert.  Psychiatric:        Mood and Affect: Mood and affect normal.        Speech: Speech normal.        Behavior: Behavior normal.     ED Results / Procedures / Treatments   Labs (all labs ordered are listed, but only abnormal results are displayed) Labs Reviewed  COMPREHENSIVE METABOLIC PANEL - Abnormal; Notable for the following components:      Result Value   Glucose, Bld 129 (*)    All other components within normal limits  CBC WITH DIFFERENTIAL/PLATELET - Abnormal; Notable for the following components:   WBC 15.2 (*)    Neutro Abs 13.0 (*)    All other components within normal limits  SARS CORONAVIRUS 2 BY RT PCR (HOSPITAL ORDER, PERFORMED IN [facility])  LIPASE, BLOOD  SURGICAL PATHOLOGY    EKG None  Radiology No results found.         Procedures .Critical Care Performed by: , PA-C Authorized by: Anselm Pancoast, PA-C   Critical care provider statement:    Critical care time (minutes):  35   Critical care time was exclusive of:  Separately billable procedures and treating other patients   Critical care was necessary to treat or prevent imminent or life-threatening deterioration of the following conditions: Acute appendicitis requiring urgent surgery.   Critical care was time spent personally by me on the following activities:  Ordering and performing treatments and interventions, ordering and review of laboratory studies, obtaining  history from patient or surrogate, examination of patient, discussions with consultants and development of treatment plan with patient or surrogate (Review of CT scan results.)   I assumed direction of critical care for this patient from another provider in my specialty: no     (including critical care time)  Medications Ordered in ED Medications  piperacillin-tazobactam (ZOSYN) IVPB 3.375 g (3.375 g Intravenous New Bag/Given 06/05/19 1944)  0.9 % irrigation (POUR BTL) (1,000 mLs Irrigation Given 06/05/19 1940)  ondansetron (ZOFRAN) injection 4  mg (4 mg Intravenous Given 06/05/19 1944)  morphine 4 MG/ML injection 4 mg (4 mg Intravenous Given 06/05/19 1945)  sodium chloride 0.9 % bolus 1,000 mL (1,000 mLs Intravenous New Bag/Given 06/05/19 1949)    ED Course  I have reviewed the triage vital signs and the nursing notes.  Pertinent labs & imaging results that were available during my care of the patient were reviewed by me and considered in my medical decision making (see chart for details).  Clinical Course as of Jun 04 2008  Thu Jun 05, 2019  1738 Spoke with Dr. Kieth Brightly, general surgeon. He states we do not need to repeat the CT scan as long as we have a printout of the interpretation to act upon.  He will also need a Covid test and the typical labs.   [SJ]  [facility], spoke with Minor [place].  She states she will fax over the read for the CT.   [SJ]  1757 Fax received. Picture of CT read put into my note. Dr. Kieth Brightly updated. Charge RN was told of patient's condition and ED bed was requested. Currently very high volume in ED making space very limited.   [SJ]    Clinical Course User Index [SJ] Teighan Aubert, Helane Gunther, PA-C   MDM Rules/Calculators/A&P                      Patient presents with abdominal pain beginning this morning. Patient is nontoxic appearing, afebrile, not tachycardic, not tachypneic, not hypotensive, maintains excellent SPO2 on room air,  and is in no apparent distress.   I reviewed and interpreted the patient's labs.  Patient with acute appendicitis.  Admitted via general surgery.   Findings and plan of care discussed with Gareth Morgan, MD.   Vitals:   06/05/19 1709 06/05/19 1945  BP: 127/88 132/88  Pulse: 81 74  Resp: 20 16  Temp: 98.6 F (37 C)   TempSrc: Oral   SpO2: 98% 98%  Weight: 68 kg   Height: 5\' 8"  (1.727 m)      Final Clinical Impression(s) / ED Diagnoses Final diagnoses:  Acute appendicitis, unspecified acute appendicitis type    Rx / DC Orders ED Discharge Orders    None       Layla Maw 06/05/19 2012    Gareth Morgan, MD 06/06/19 1325

## 2019-06-05 NOTE — ED Triage Notes (Signed)
Pt reports severe RLQ pain since early this morning. Pt sent here by PCP to rule out appendicitis. Also reports n/v/d. Denies blood in stool.

## 2019-06-05 NOTE — Op Note (Signed)
Preoperative diagnosis: acute appendicitis with peritonitis  Postoperative diagnosis: Same   Procedure: laparoscopic appendectomy  Surgeon: Feliciana Rossetti, M.D.  Asst: none  Anesthesia: Gen.   Indications for procedure: Anthony Newman is a 64 y.o. male with symptoms of pain in right lower quadrant consistent with acute appendicitis. Confirmed by CT scan and laboratory values.  Description of procedure: The patient was brought into the operative suite, placed supine. Anesthesia was administered with endotracheal tube. The patient's left arm was tucked. All pressure points were offloaded by foam padding. The patient was prepped and draped in the usual sterile fashion.  A transverse incision was made to the left of the umbilicus and a 91mm trocar was [place]. Pneumoperitoneum was applied with high flow low pressure.  2 37mm trocars were placed, one in the suprapubic space, one in the LLQ, the periumbilical incision was then up-sized and a 60mm trocar placed in that space. A transversus abdominal block was placed on the left and right sides. Next, the patient was placed in trendelenberg, rotated to the left. The omentum was retracted cephalad. The cecum and appendix were identified. The appendix was dilated and inflamed. There was a small amount of yellow fluid near the tip but no sign of perforation. The base of the appendix was dissected and a window through the mesoappendix was created with blunt dissection. Large Hem-o-lock clips were used to doubly ligate the base of the appendix and mesoappendix. The appendix was cut free with scissors.  The appendix was placed in a specimen bag. The pelvis and RLQ were irrigated. There was no purulence seen in the pelvis. The appendix was removed via the umbilicus. 0 vicryl was used to close the fascial defect. Pneumoperitoneum was removed, all trocars were removed. All incisions were closed with 4-0 monocryl subcuticular stitch. The patient woke from anesthesia and  was brought to PACU in stable condition.  Findings: acute appendicitis  Specimen: appendix  Blood loss: 20 ml  Local anesthesia: 30 ml marcaine  Complications: none  Images:       Feliciana Rossetti, M.D. General, Bariatric, & Minimally Invasive Surgery [facility]

## 2019-06-05 NOTE — H&P (Addendum)
Reason for Consult: abdominal pain Referring Physician: Harolyn Rutherford [facility]  Anthony Newman is an 64 y.o. male.  HPI: 64 yo male with 1 day of right lower quadrant pain. Pain came out of nowhere. He denies vomiting but has some nausea. He takes plavix and aspirin for a heart attack in 10 years ago.  Past Medical History:  Diagnosis Date   Coronary artery disease     History reviewed. No pertinent surgical history.  No family history on file.  Social History:  reports that he has been smoking. He does not have any smokeless tobacco history on file. He reports that he does not drink alcohol or use drugs.  Allergies: No Known Allergies  Medications: I have reviewed the patient's current medications.  Results for orders placed or performed during the hospital encounter of 06/05/19 (from the past 48 hour(s))  Comprehensive metabolic panel     Status: Abnormal   Collection Time: 06/05/19  5:37 PM  Result Value Ref Range   Sodium 136 135 - 145 mmol/L   Potassium 4.1 3.5 - 5.1 mmol/L   Chloride 99 98 - 111 mmol/L   CO2 22 22 - 32 mmol/L   Glucose, Bld 129 (H) 70 - 99 mg/dL    Comment: Glucose reference range applies only to samples taken after fasting for at least 8 hours.   BUN 9 8 - 23 mg/dL   Creatinine, Ser 7.51 0.61 - 1.24 mg/dL   Calcium 9.5 8.9 - 70.0 mg/dL   Total Protein 7.4 6.5 - 8.1 g/dL   Albumin 4.2 3.5 - 5.0 g/dL   AST 19 15 - 41 U/L   ALT 20 0 - 44 U/L   Alkaline Phosphatase 89 38 - 126 U/L   Total Bilirubin 0.9 0.3 - 1.2 mg/dL   GFR calc non Af Amer >60 >60 mL/min   GFR calc Af Amer >60 >60 mL/min   Anion gap 15 5 - 15    Comment: Performed at [facility], [address] [place], [place] [address]  CBC with Differential     Status: Abnormal   Collection Time: 06/05/19  5:37 PM  Result Value Ref Range   WBC 15.2 (H) 4.0 - 10.5 K/uL   RBC 4.68 4.22 - 5.81 MIL/uL   Hemoglobin 14.3 13.0 - 17.0 g/dL   HCT 49.6 75.9 - 16.3 %   MCV 90.4 80.0 - 100.0 fL    MCH 30.6 26.0 - 34.0 pg   MCHC 33.8 30.0 - 36.0 g/dL   RDW 84.6 65.9 - 93.5 %   Platelets 255 150 - 400 K/uL   nRBC 0.0 0.0 - 0.2 %   Neutrophils Relative % 85 %   Neutro Abs 13.0 (H) 1.7 - 7.7 K/uL   Lymphocytes Relative 10 %   Lymphs Abs 1.5 0.7 - 4.0 K/uL   Monocytes Relative 4 %   Monocytes Absolute 0.7 0.1 - 1.0 K/uL   Eosinophils Relative 0 %   Eosinophils Absolute 0.0 0.0 - 0.5 K/uL   Basophils Relative 0 %   Basophils Absolute 0.0 0.0 - 0.1 K/uL   Immature Granulocytes 1 %   Abs Immature Granulocytes 0.07 0.00 - 0.07 K/uL    Comment: Performed at [facility], [address], [place], [place] [address]  Lipase, blood     Status: None   Collection Time: 06/05/19  5:37 PM  Result Value Ref Range   Lipase 24 11 - 51 U/L  Comment: Performed at [facility], [place] [address], [place], [place] [address]    No results found.  Review of Systems  Constitutional: Negative for chills and fever.  HENT: Negative for hearing loss.   Eyes: Negative for blurred vision and double vision.  Respiratory: Negative for cough and hemoptysis.   Cardiovascular: Negative for chest pain and palpitations.  Gastrointestinal: Positive for abdominal pain and nausea. Negative for vomiting.  Genitourinary: Negative for dysuria and urgency.  Musculoskeletal: Negative for myalgias and neck pain.  Skin: Negative for itching and rash.  Neurological: Negative for dizziness, tingling and headaches.  Endo/Heme/Allergies: Does not bruise/bleed easily.  Psychiatric/Behavioral: Negative for depression and suicidal ideas.  All other systems reviewed and are negative.   PE Blood pressure 127/88, pulse 81, temperature 98.6 F (37 C), temperature source Oral, resp. rate 20, height 5\' 8"  (1.727 m), weight 68 kg, SpO2 98 %. Constitutional: NAD; conversant; no deformities Eyes: Moist conjunctiva; no lid lag; anicteric; PERRL Neck: Trachea midline; no thyromegaly Lungs: Normal respiratory effort;  no tactile fremitus CV: RRR; no palpable thrills; no pitting edema GI: Abd right lower quadrant tenderness, guarding, negative iliopsoas sign; no palpable hepatosplenomegaly MSK: Normal gait; no clubbing/cyanosis Psychiatric: Appropriate affect; alert and oriented x3 Lymphatic: No palpable cervical or axillary lymphadenopathy   Assessment/Plan: 64 yo male with acute appendicitis, he has dual antiplatelet therapy -lap appendectomy -IV zosyn -obs stay post op  [name] 06/05/2019, 6:45 PM

## 2019-06-05 NOTE — Anesthesia Procedure Notes (Signed)
Procedure Name: Intubation Date/Time: 06/05/2019 8:40 PM Performed by: Claris Che, CRNA Pre-anesthesia Checklist: Patient identified, Emergency Drugs available, Suction available, Patient being monitored and Timeout performed Patient Re-evaluated:Patient Re-evaluated prior to induction Oxygen Delivery Method: Circle system utilized Preoxygenation: Pre-oxygenation with 100% oxygen Induction Type: IV induction, Rapid sequence and Cricoid Pressure applied Laryngoscope Size: Mac and 4 Grade View: Grade III Tube type: Oral Tube size: 8.0 mm Number of attempts: 1 Airway Equipment and Method: Stylet Placement Confirmation: ETT inserted through vocal cords under direct vision,  positive ETCO2 and breath sounds checked- equal and bilateral Secured at: 24 cm Tube secured with: Tape Dental Injury: Teeth and Oropharynx as per pre-operative assessment

## 2019-06-05 NOTE — Anesthesia Preprocedure Evaluation (Addendum)
Anesthesia Evaluation  Patient identified by MRN, date of birth, ID band Patient awake    Reviewed: Allergy & Precautions, NPO status , Patient's Chart, lab work & pertinent test results, reviewed documented beta blocker date and time   Airway Mallampati: III  TM Distance: >3 FB Neck ROM: Full    Dental no notable dental hx.    Pulmonary Current SmokerPatient did not abstain from smoking.,    Pulmonary exam normal breath sounds clear to auscultation       Cardiovascular hypertension, Pt. on home beta blockers and Pt. on medications + angina + CAD, + Past MI and + Cardiac Stents (x 1 in 2010)  Normal cardiovascular exam Rhythm:Regular Rate:Normal     Neuro/Psych PSYCHIATRIC DISORDERS negative neurological ROS     GI/Hepatic Neg liver ROS, GERD  Medicated and Controlled,  Endo/Other  negative endocrine ROS  Renal/GU negative Renal ROS     Musculoskeletal negative musculoskeletal ROS (+)   Abdominal   Peds  Hematology HLD   Anesthesia Other Findings appendicitis  Reproductive/Obstetrics                            Anesthesia Physical Anesthesia Plan  ASA: IV and emergent  Anesthesia Plan: General   Post-op Pain Management:    Induction: Intravenous  PONV Risk Score and Plan: 1 and Ondansetron, Dexamethasone, Midazolam and Treatment may vary due to age or medical condition  Airway Management Planned: Oral ETT  Additional Equipment:   Intra-op Plan:   Post-operative Plan: Extubation in OR  Informed Consent: I have reviewed the patients History and Physical, chart, labs and discussed the procedure including the risks, benefits and alternatives for the proposed anesthesia with the patient or authorized representative who has indicated his/her understanding and acceptance.     Dental advisory given  Plan Discussed with: CRNA  Anesthesia Plan Comments:        Anesthesia Quick  Evaluation

## 2019-06-05 NOTE — ED Provider Notes (Signed)
MSE was initiated and I personally evaluated the patient and placed orders (if any) at  5:16 PM on June 05, 2019.   Anthony Newman is a 64 y.o. male, presenting to the ED with abdominal pain beginning this morning around 6:30 am this morning.  Pain lower abdomen at onset, now in the entire right side of abdomen, vague description, severe, radiating throughout rest of abdomen.  Nausea. One episode of vomiting. Last food was last night.  States he went to his doctor at [facility] today, a CT scan was performed, and was told he had appendicitis and that he needed to come to the emergency department     Physical Exam:  No diaphoresis.  No pallor.  Pulmonary: No increased work of breathing.  Speaks in full sentences without difficulty. No tachypnea. Lung sounds clear.  Cardiac: Normal rate and regular. Peripheral pulses intact.  Abdominal: Tenderness to the lower and right-sided abdomen.  No peritoneal signs.  No rebound tenderness.  No guarding.     The patient appears stable so that the remainder of the MSE may be completed by another provider.   Vitals:   06/05/19 1709 06/05/19 1945  BP: 127/88 132/88  Pulse: 81 74  Resp: 20 16  Temp: 98.6 F (37 C)   TempSrc: Oral   SpO2: 98% 98%  Weight: 68 kg   Height: 5\' 8"  (1.727 m)       06/05/19 2010    08/05/19, MD 06/06/19 1325

## 2019-06-05 NOTE — Transfer of Care (Signed)
Immediate Anesthesia Transfer of Care Note  Patient: Anthony Newman  Procedure(s) Performed: APPENDECTOMY LAPAROSCOPIC (N/A )  Patient Location: PACU  Anesthesia Type:General  Level of Consciousness: awake, drowsy, patient cooperative and responds to stimulation  Airway & Oxygen Therapy: Patient Spontanous Breathing and Patient connected to nasal cannula oxygen  Post-op Assessment: Report given to RN, Post -op Vital signs reviewed and stable and Patient moving all extremities X 4  Post vital signs: Reviewed and stable  Last Vitals:  Vitals Value Taken Time  BP 131/98 06/05/19 2142  Temp    Pulse 98 06/05/19 2142  Resp 16 06/05/19 2142  SpO2 99 % 06/05/19 2142  Vitals shown include unvalidated device data.  Last Pain:  Vitals:   06/05/19 1709  TempSrc: Oral  PainSc: 10-Worst pain ever         Complications: No apparent anesthesia complications

## 2019-06-06 ENCOUNTER — Encounter (HOSPITAL_COMMUNITY): Payer: Self-pay

## 2019-06-06 DIAGNOSIS — K353 Acute appendicitis with localized peritonitis, without perforation or gangrene: Secondary | ICD-10-CM | POA: Diagnosis not present

## 2019-06-06 DIAGNOSIS — R1031 Right lower quadrant pain: Secondary | ICD-10-CM | POA: Diagnosis not present

## 2019-06-06 LAB — CBC
HCT: 38.2 % — ABNORMAL LOW (ref 39.0–52.0)
Hemoglobin: 13 g/dL (ref 13.0–17.0)
MCH: 30.4 pg (ref 26.0–34.0)
MCHC: 34 g/dL (ref 30.0–36.0)
MCV: 89.3 fL (ref 80.0–100.0)
Platelets: 226 10*3/uL (ref 150–400)
RBC: 4.28 MIL/uL (ref 4.22–5.81)
RDW: 14.5 % (ref 11.5–15.5)
WBC: 14.3 10*3/uL — ABNORMAL HIGH (ref 4.0–10.5)
nRBC: 0 % (ref 0.0–0.2)

## 2019-06-06 LAB — CREATININE, SERUM
Creatinine, Ser: 0.97 mg/dL (ref 0.61–1.24)
GFR calc Af Amer: >60 mL/min (ref >60)
GFR calc non Af Amer: >60 mL/min (ref >60)

## 2019-06-06 NOTE — Care Management CC44 (Signed)
Condition Code 44 Documentation Completed  Patient Details  Name: NATHANYAL ASHMEAD MRN: [id number] Date of Birth: [birth year]   Condition Code 44 given:  Yes Patient signature on Condition Code 44 notice:  Yes Documentation of 2 MD's agreement:  Yes Code 44 added to claim:  Yes    Kingsley Plan, RN 06/06/2019, 9:02 AM

## 2019-06-06 NOTE — Plan of Care (Signed)
  Problem: Clinical Measurements: Goal: Ability to maintain clinical measurements within normal limits will improve Outcome: Progressing Goal: Will remain free from infection Outcome: Progressing Goal: Diagnostic test results will improve Outcome: Progressing   Problem: Activity: Goal: Risk for activity intolerance will decrease Outcome: Progressing   Problem: Elimination: Goal: Will not experience complications related to bowel motility Outcome: Progressing   Problem: Safety: Goal: Ability to remain free from injury will improve Outcome: Progressing   Problem: Skin Integrity: Goal: Risk for impaired skin integrity will decrease Outcome: Progressing   

## 2019-06-06 NOTE — Progress Notes (Signed)
Patient discharged to home with instructions. 

## 2019-06-06 NOTE — Care Management Obs Status (Signed)
MEDICARE OBSERVATION STATUS NOTIFICATION   Patient Details  Name: Anthony Newman MRN: [id number] Date of Birth: Mar 12, 1955   Medicare Observation Status Notification Given:  Yes    Kingsley Plan, RN 06/06/2019, 9:02 AM

## 2019-06-06 NOTE — Discharge Summary (Signed)
Patient ID: Anthony Newman [id number] 1955/06/21 64 y.o.  Admit date: 06/05/2019 Discharge date: 06/06/2019  Admitting Diagnosis: Acute Appendicitis   Discharge Diagnosis Patient Active Problem List   Diagnosis Date Noted  . Appendicitis 06/05/2019  . Acute appendicitis 06/05/2019   Consultants None   Reason for Admission: 64 yo male with 1 day of right lower quadrant pain. Pain came out of nowhere. He denies vomiting but has some nausea. He takes plavix and aspirin for a heart attack in 10 years ago. Patient found to have appendicitis on CT.  Procedures Dr. Sheliah Hatch - Laparoscopic Appendectomy - 06/05/2019  Hospital Course:  The patient was admitted and underwent a laparoscopic appendectomy.  The patient tolerated the procedure well.  On POD 1, the patient was tolerating a regular diet, voiding well, mobilizing, and pain was controlled with oral pain medications.  The patient was stable for DC home at this time with appropriate follow up made. Patient is in a pain clinic and informed he did not need any pain medication at discharge.   Physical Exam: Gen:  Alert, NAD, pleasant Lungs: Normal rate and effort  Abd: Soft, ND, appropriately tender, +BS. Incisions with glue intact appears well and are without drainage, bleeding, or signs of infection Ext:  No erythema, edema, or tenderness BUE/BLE  Psych: A&Ox3  Skin: no rashes noted, warm and dry  Allergies as of 06/06/2019   No Known Allergies     Medication List    TAKE these medications   aspirin EC 325 MG tablet Take 325 mg by mouth daily.   atorvastatin 40 MG tablet Commonly known as: LIPITOR Take 40 mg by mouth daily.   carvedilol 6.25 MG tablet Commonly known as: COREG Take 6.25 mg by mouth 2 (two) times daily with a meal.   clopidogrel 75 MG tablet Commonly known as: PLAVIX Take 75 mg by mouth every evening.   dicyclomine 10 MG capsule Commonly known as: BENTYL Take 10 mg by mouth 3 (three) times daily as  needed for spasms.   gabapentin 300 MG capsule Commonly known as: NEURONTIN Take 300 mg by mouth 3 (three) times daily.   HYDROcodone-acetaminophen 10-325 MG tablet Commonly known as: NORCO Take 0.5-1 tablets by mouth every 6 (six) hours as needed.   nitroGLYCERIN 0.4 MG SL tablet Commonly known as: NITROSTAT Place 0.4 mg under the tongue every 5 (five) minutes as needed for chest pain.   omeprazole 40 MG capsule Commonly known as: PRILOSEC Take 40 mg by mouth daily.   quinapril 5 MG tablet Commonly known as: ACCUPRIL Take 5 mg by mouth daily.   ranitidine 150 MG tablet Commonly known as: ZANTAC Take 150 mg by mouth 2 (two) times daily.   sertraline 50 MG tablet Commonly known as: ZOLOFT Take 50 mg by mouth daily.   spironolactone 25 MG tablet Commonly known as: ALDACTONE Take 12.5 mg by mouth daily.        Follow-up Information    [facility], [facility]. Call.   Specialty: General Surgery Why: Please call to confirm your appointment time. We are working hard to make this for you. Please arrive to your appointment 30 minutes early for paperwork. Please bring a copy of your photo ID and insurance card.  Contact information: [address] [place] [address] [phone]           Signed: Leary Roca, [facility] 06/06/2019, 8:23 AM Please see Amion for pager number during day hours  7:00am-4:30pm

## 2019-06-06 NOTE — Discharge Instructions (Signed)
Continue your home pain medication   [facility]  Please arrive at least 30 min before your appointment to complete your check in paperwork.  If you are unable to arrive 30 min prior to your appointment time we may have to cancel or reschedule you. LAPAROSCOPIC SURGERY: POST OP INSTRUCTIONS Always review your discharge instruction sheet given to you by the facility where your surgery was performed. IF YOU HAVE DISABILITY OR FAMILY LEAVE FORMS, YOU MUST BRING THEM TO THE OFFICE FOR PROCESSING.   DO NOT GIVE THEM TO YOUR DOCTOR.  PAIN CONTROL  1. First take acetaminophen (Tylenol) AND/or ibuprofen (Advil) to control your pain after surgery.  Follow directions on package.  Taking acetaminophen (Tylenol) and/or ibuprofen (Advil) regularly after surgery will help to control your pain and lower the amount of prescription pain medication you may need.  You should not take more than 4,000 mg (4 grams) of acetaminophen (Tylenol) in 24 hours.  You should not take ibuprofen (Advil), aleve, motrin, naprosyn or other NSAIDS if you have a history of stomach ulcers or chronic kidney disease or are on Plavix such as yourself.  2. A prescription for pain medication may be given to you upon discharge.  Take your pain medication as prescribed, if you still have uncontrolled pain after taking acetaminophen (Tylenol) or ibuprofen (Advil). 3. Use ice packs to help control pain. 4. If you need a refill on your pain medication, please contact your pharmacy.  They will contact our office to request authorization. Prescriptions will not be filled after 5pm or on week-ends.  HOME MEDICATIONS 5. Take your usually prescribed medications unless otherwise directed.  DIET 6. You should follow a light diet the first few days after arrival home.  Be sure to include lots of fluids daily. Avoid fatty, fried foods.   CONSTIPATION 7. It is common to experience some constipation after surgery and if you are  taking pain medication.  Increasing fluid intake and taking a stool softener (such as Colace) will usually help or prevent this problem from occurring.  A mild laxative (Milk of Magnesia or Miralax) should be taken according to package instructions if there are no bowel movements after 48 hours.  WOUND/INCISION CARE 8. Most patients will experience some swelling and bruising in the area of the incisions.  Ice packs will help.  Swelling and bruising can take several days to resolve.  9. Unless discharge instructions indicate otherwise, follow guidelines below  a. STERI-STRIPS - you may remove your outer bandages 48 hours after surgery, and you may shower at that time.  You have steri-strips (small skin tapes) in place directly over the incision.  These strips should be left on the skin for 7-10 days.   b. DERMABOND/SKIN GLUE - you may shower in 24 hours.  The glue will flake off over the next 2-3 weeks. 10. Any sutures or staples will be removed at the office during your follow-up visit.  ACTIVITIES 11. You may resume regular (light) daily activities beginning the next day--such as daily self-care, walking, climbing stairs--gradually increasing activities as tolerated.  You may have sexual intercourse when it is comfortable.  Refrain from any heavy lifting or straining until approved by your doctor. a. You may drive when you are no longer taking prescription pain medication, you can comfortably wear a seatbelt, and you can safely maneuver your car and apply brakes.  FOLLOW-UP 12. You should see your doctor in the office for a follow-up appointment approximately 2-3  weeks after your surgery.  You should have been given your post-op/follow-up appointment when your surgery was scheduled.  If you did not receive a post-op/follow-up appointment, make sure that you call for this appointment within a day or two after you arrive home to insure a convenient appointment time.   WHEN TO CALL YOUR  DOCTOR: 1. Fever over 101.0 2. Inability to urinate 3. Continued bleeding from incision. 4. Increased pain, redness, or drainage from the incision. 5. Increasing abdominal pain  The clinic staff is available to answer your questions during regular business hours.  Please don't hesitate to call and ask to speak to one of the nurses for clinical concerns.  If you have a medical emergency, go to the nearest emergency room or call 911.  A surgeon from [facility] is always on call at the hospital. [address], [place], [place]  [address] ? [address] [address], [place], [place]   [address] [phone] ? [phone] ? FAX [phone]

## 2019-06-06 NOTE — Anesthesia Postprocedure Evaluation (Signed)
Anesthesia Post Note  Patient: Anthony Newman  Procedure(s) Performed: APPENDECTOMY LAPAROSCOPIC (N/A )     Patient location during evaluation: PACU Anesthesia Type: General Level of consciousness: awake and alert Pain management: pain level controlled Vital Signs Assessment: post-procedure vital signs reviewed and stable Respiratory status: spontaneous breathing, nonlabored ventilation, respiratory function stable and patient connected to nasal cannula oxygen Cardiovascular status: blood pressure returned to baseline and stable Postop Assessment: no apparent nausea or vomiting Anesthetic complications: no    Last Vitals:  Vitals:   06/06/19 0211 06/06/19 0612  BP: 120/81 115/75  Pulse: 79 70  Resp: 18 18  Temp: 36.8 C 36.8 C  SpO2: 100% 95%    Last Pain:  Vitals:   06/06/19 0612  TempSrc: Oral  PainSc:                  Catheryn Bacon Ashleyanne Hemmingway

## 2019-06-09 ENCOUNTER — Encounter: Payer: Self-pay | Admitting: *Deleted

## 2019-06-09 LAB — SURGICAL PATHOLOGY

## 2019-06-13 ENCOUNTER — Other Ambulatory Visit (HOSPITAL_COMMUNITY): Payer: Self-pay | Admitting: Internal Medicine

## 2019-09-01 ENCOUNTER — Other Ambulatory Visit (HOSPITAL_COMMUNITY): Payer: Self-pay | Admitting: Internal Medicine

## 2019-10-07 ENCOUNTER — Other Ambulatory Visit (HOSPITAL_COMMUNITY): Payer: Self-pay | Admitting: Internal Medicine

## 2019-10-20 ENCOUNTER — Other Ambulatory Visit (HOSPITAL_COMMUNITY): Payer: Self-pay | Admitting: General Practice

## 2019-11-12 ENCOUNTER — Other Ambulatory Visit (HOSPITAL_COMMUNITY): Payer: Self-pay | Admitting: Internal Medicine

## 2019-12-07 ENCOUNTER — Other Ambulatory Visit (HOSPITAL_COMMUNITY): Payer: Self-pay | Admitting: Internal Medicine

## 2019-12-22 ENCOUNTER — Other Ambulatory Visit (HOSPITAL_COMMUNITY): Payer: Self-pay | Admitting: Internal Medicine

## 2020-02-04 ENCOUNTER — Other Ambulatory Visit (HOSPITAL_COMMUNITY): Payer: Self-pay | Admitting: Internal Medicine

## 2020-02-24 ENCOUNTER — Other Ambulatory Visit (HOSPITAL_COMMUNITY): Payer: Self-pay | Admitting: Internal Medicine

## 2020-04-05 ENCOUNTER — Other Ambulatory Visit (HOSPITAL_COMMUNITY): Payer: Self-pay

## 2020-04-06 ENCOUNTER — Other Ambulatory Visit (HOSPITAL_COMMUNITY): Payer: Self-pay

## 2020-04-07 ENCOUNTER — Other Ambulatory Visit (HOSPITAL_COMMUNITY): Payer: Self-pay

## 2020-04-07 MED ORDER — ATORVASTATIN CALCIUM 80 MG PO TABS
1.0000 | ORAL_TABLET | Freq: Every day | ORAL | 2 refills | Status: DC
  Filled 2020-04-07: qty 30, 30d supply, fill #0
  Filled 2020-05-04: qty 30, 30d supply, fill #1
  Filled 2020-06-04: qty 30, 30d supply, fill #2

## 2020-04-07 MED ORDER — QUINAPRIL HCL 5 MG PO TABS
5.0000 mg | ORAL_TABLET | Freq: Every day | ORAL | 2 refills | Status: DC
  Filled 2020-04-07: qty 30, 30d supply, fill #0
  Filled 2020-05-04: qty 30, 30d supply, fill #1
  Filled 2020-06-04: qty 30, 30d supply, fill #2

## 2020-04-07 MED ORDER — MONTELUKAST SODIUM 10 MG PO TABS
10.0000 mg | ORAL_TABLET | Freq: Every day | ORAL | 3 refills | Status: DC
  Filled 2020-04-07: qty 30, 30d supply, fill #0
  Filled 2020-05-04: qty 30, 30d supply, fill #1
  Filled 2020-06-04: qty 30, 30d supply, fill #2
  Filled 2020-07-08: qty 30, 30d supply, fill #3

## 2020-04-07 MED ORDER — CLOPIDOGREL BISULFATE 75 MG PO TABS
1.0000 | ORAL_TABLET | Freq: Every day | ORAL | 2 refills | Status: DC
  Filled 2020-04-07: qty 30, 30d supply, fill #0
  Filled 2020-05-04: qty 30, 30d supply, fill #1
  Filled 2020-06-04: qty 30, 30d supply, fill #2

## 2020-04-07 MED ORDER — SPIRONOLACTONE 25 MG PO TABS
0.5000 | ORAL_TABLET | Freq: Every day | ORAL | 2 refills | Status: DC
  Filled 2020-04-07: qty 15, 30d supply, fill #0
  Filled 2020-05-04: qty 15, 30d supply, fill #1
  Filled 2020-06-04: qty 15, 30d supply, fill #2

## 2020-04-07 MED ORDER — CARVEDILOL 6.25 MG PO TABS
6.2500 mg | ORAL_TABLET | Freq: Two times a day (BID) | ORAL | 2 refills | Status: DC
  Filled 2020-04-07: qty 60, 30d supply, fill #0
  Filled 2020-05-04: qty 60, 30d supply, fill #1
  Filled 2020-06-04: qty 60, 30d supply, fill #2

## 2020-04-07 MED ORDER — SERTRALINE HCL 50 MG PO TABS
1.0000 | ORAL_TABLET | Freq: Every day | ORAL | 2 refills | Status: DC
  Filled 2020-04-07: qty 30, 30d supply, fill #0
  Filled 2020-05-04: qty 30, 30d supply, fill #1
  Filled 2020-06-04: qty 30, 30d supply, fill #2

## 2020-04-07 MED ORDER — GABAPENTIN 300 MG PO CAPS
300.0000 mg | ORAL_CAPSULE | Freq: Three times a day (TID) | ORAL | 2 refills | Status: DC
  Filled 2020-04-07: qty 90, 30d supply, fill #0
  Filled 2020-05-04: qty 90, 30d supply, fill #1
  Filled 2020-06-04: qty 90, 30d supply, fill #2

## 2020-04-08 ENCOUNTER — Other Ambulatory Visit (HOSPITAL_COMMUNITY): Payer: Self-pay

## 2020-04-08 MED ORDER — HYDROCODONE-ACETAMINOPHEN 10-325 MG PO TABS
1.0000 | ORAL_TABLET | Freq: Two times a day (BID) | ORAL | 0 refills | Status: DC
  Filled 2020-04-08: qty 60, 30d supply, fill #0

## 2020-04-08 MED FILL — Dicyclomine HCl Cap 10 MG: ORAL | 30 days supply | Qty: 120 | Fill #0 | Status: AC

## 2020-04-09 ENCOUNTER — Other Ambulatory Visit (HOSPITAL_COMMUNITY): Payer: Self-pay

## 2020-04-13 ENCOUNTER — Other Ambulatory Visit (HOSPITAL_COMMUNITY): Payer: Self-pay

## 2020-05-03 ENCOUNTER — Other Ambulatory Visit (HOSPITAL_COMMUNITY): Payer: Self-pay

## 2020-05-03 MED ORDER — HYDROCODONE-ACETAMINOPHEN 10-325 MG PO TABS
ORAL_TABLET | ORAL | 0 refills | Status: DC
  Filled 2020-05-04 – 2020-05-06 (×2): qty 60, 30d supply, fill #0

## 2020-05-04 ENCOUNTER — Other Ambulatory Visit (HOSPITAL_COMMUNITY): Payer: Self-pay

## 2020-05-04 MED FILL — Dicyclomine HCl Cap 10 MG: ORAL | 30 days supply | Qty: 120 | Fill #1 | Status: AC

## 2020-05-06 ENCOUNTER — Other Ambulatory Visit (HOSPITAL_COMMUNITY): Payer: Self-pay

## 2020-05-06 MED ORDER — LIDOCAINE VISCOUS HCL 2 % MT SOLN
OROMUCOSAL | 6 refills | Status: DC
  Filled 2020-05-06: qty 480, 7d supply, fill #0

## 2020-05-10 ENCOUNTER — Other Ambulatory Visit (HOSPITAL_COMMUNITY): Payer: Self-pay

## 2020-06-04 ENCOUNTER — Other Ambulatory Visit (HOSPITAL_COMMUNITY): Payer: Self-pay

## 2020-06-04 MED FILL — Dicyclomine HCl Cap 10 MG: ORAL | 30 days supply | Qty: 120 | Fill #2 | Status: AC

## 2020-06-07 ENCOUNTER — Other Ambulatory Visit (HOSPITAL_COMMUNITY): Payer: Self-pay

## 2020-06-07 MED ORDER — HYDROCODONE-ACETAMINOPHEN 10-325 MG PO TABS
ORAL_TABLET | ORAL | 0 refills | Status: DC
  Filled 2020-06-07: qty 60, 30d supply, fill #0

## 2020-06-09 ENCOUNTER — Other Ambulatory Visit (HOSPITAL_COMMUNITY): Payer: Self-pay

## 2020-07-08 ENCOUNTER — Other Ambulatory Visit (HOSPITAL_COMMUNITY): Payer: Self-pay

## 2020-07-08 MED ORDER — SERTRALINE HCL 50 MG PO TABS
50.0000 mg | ORAL_TABLET | Freq: Every day | ORAL | 2 refills | Status: DC
  Filled 2020-07-08: qty 30, 30d supply, fill #0
  Filled 2020-08-04: qty 30, 30d supply, fill #1
  Filled 2020-09-08: qty 30, 30d supply, fill #2

## 2020-07-09 ENCOUNTER — Other Ambulatory Visit (HOSPITAL_COMMUNITY): Payer: Self-pay

## 2020-07-09 MED ORDER — HYDROCODONE-ACETAMINOPHEN 10-325 MG PO TABS
ORAL_TABLET | ORAL | 0 refills | Status: DC
  Filled 2020-07-09: qty 60, 30d supply, fill #0

## 2020-07-09 MED ORDER — QUINAPRIL HCL 5 MG PO TABS
5.0000 mg | ORAL_TABLET | Freq: Every day | ORAL | 3 refills | Status: DC
  Filled 2020-07-09: qty 30, 30d supply, fill #0
  Filled 2020-08-04: qty 30, 30d supply, fill #1
  Filled 2020-09-08: qty 30, 30d supply, fill #2

## 2020-07-09 MED ORDER — MONTELUKAST SODIUM 10 MG PO TABS
ORAL_TABLET | ORAL | 3 refills | Status: DC
  Filled 2020-08-09: qty 30, 30d supply, fill #0
  Filled 2020-09-08: qty 30, 30d supply, fill #1
  Filled 2020-10-05: qty 30, 30d supply, fill #2
  Filled 2020-11-11: qty 30, 30d supply, fill #3

## 2020-07-12 ENCOUNTER — Other Ambulatory Visit (HOSPITAL_COMMUNITY): Payer: Self-pay

## 2020-07-14 ENCOUNTER — Other Ambulatory Visit (HOSPITAL_COMMUNITY): Payer: Self-pay

## 2020-07-23 ENCOUNTER — Other Ambulatory Visit (HOSPITAL_COMMUNITY): Payer: Self-pay

## 2020-08-04 ENCOUNTER — Other Ambulatory Visit (HOSPITAL_COMMUNITY): Payer: Self-pay

## 2020-08-04 MED ORDER — HYDROCODONE-ACETAMINOPHEN 10-325 MG PO TABS
1.0000 | ORAL_TABLET | Freq: Two times a day (BID) | ORAL | 0 refills | Status: DC
  Filled 2020-08-09: qty 60, 30d supply, fill #0

## 2020-08-05 ENCOUNTER — Other Ambulatory Visit (HOSPITAL_COMMUNITY): Payer: Self-pay

## 2020-08-06 ENCOUNTER — Other Ambulatory Visit (HOSPITAL_COMMUNITY): Payer: Self-pay

## 2020-08-09 ENCOUNTER — Other Ambulatory Visit (HOSPITAL_COMMUNITY): Payer: Self-pay

## 2020-08-09 MED ORDER — QUINAPRIL HCL 5 MG PO TABS
5.0000 mg | ORAL_TABLET | Freq: Every day | ORAL | 3 refills | Status: DC
  Filled 2020-08-09 – 2020-10-05 (×2): qty 30, 30d supply, fill #0
  Filled 2020-11-11 – 2021-01-05 (×2): qty 30, 30d supply, fill #1
  Filled 2021-03-11: qty 30, 30d supply, fill #2

## 2020-08-09 MED ORDER — CARVEDILOL 6.25 MG PO TABS
ORAL_TABLET | ORAL | 2 refills | Status: DC
  Filled 2020-08-09: qty 60, 30d supply, fill #0
  Filled 2020-09-08: qty 60, 30d supply, fill #1
  Filled 2020-10-05: qty 60, 30d supply, fill #2

## 2020-08-09 MED ORDER — SPIRONOLACTONE 25 MG PO TABS
12.5000 mg | ORAL_TABLET | Freq: Every day | ORAL | 2 refills | Status: DC
  Filled 2020-08-09: qty 15, 30d supply, fill #0
  Filled 2020-09-08: qty 15, 30d supply, fill #1
  Filled 2020-10-05: qty 15, 30d supply, fill #2

## 2020-08-09 MED FILL — Albuterol Sulfate Inhal Aero 108 MCG/ACT (90MCG Base Equiv): RESPIRATORY_TRACT | 25 days supply | Qty: 18 | Fill #0 | Status: AC

## 2020-08-10 ENCOUNTER — Other Ambulatory Visit (HOSPITAL_COMMUNITY): Payer: Self-pay

## 2020-08-11 ENCOUNTER — Other Ambulatory Visit (HOSPITAL_COMMUNITY): Payer: Self-pay

## 2020-08-11 MED ORDER — ATORVASTATIN CALCIUM 80 MG PO TABS
80.0000 mg | ORAL_TABLET | Freq: Every day | ORAL | 2 refills | Status: DC
  Filled 2020-08-11: qty 30, 30d supply, fill #0
  Filled 2020-09-08: qty 30, 30d supply, fill #1
  Filled 2020-10-05: qty 30, 30d supply, fill #2

## 2020-08-11 MED ORDER — CLOPIDOGREL BISULFATE 75 MG PO TABS
75.0000 mg | ORAL_TABLET | Freq: Every day | ORAL | 2 refills | Status: DC
  Filled 2020-08-11: qty 30, 30d supply, fill #0
  Filled 2020-09-08: qty 30, 30d supply, fill #1
  Filled 2020-10-05: qty 30, 30d supply, fill #2

## 2020-08-12 ENCOUNTER — Other Ambulatory Visit (HOSPITAL_COMMUNITY): Payer: Self-pay

## 2020-09-08 ENCOUNTER — Other Ambulatory Visit (HOSPITAL_COMMUNITY): Payer: Self-pay

## 2020-09-09 ENCOUNTER — Other Ambulatory Visit (HOSPITAL_COMMUNITY): Payer: Self-pay

## 2020-09-09 MED ORDER — ALUM & MAG HYDROXIDE-SIMETH 200-200-20 MG/5ML PO SUSP
ORAL | 12 refills | Status: DC
  Filled 2020-09-09: qty 240, 2d supply, fill #0

## 2020-09-09 MED ORDER — LIDOCAINE VISCOUS HCL 2 % MT SOLN
OROMUCOSAL | 99 refills | Status: DC
  Filled 2020-09-09: qty 240, 2d supply, fill #0

## 2020-09-09 MED ORDER — HYDROCODONE-ACETAMINOPHEN 10-325 MG PO TABS
ORAL_TABLET | ORAL | 0 refills | Status: DC
  Filled 2020-09-09: qty 60, 30d supply, fill #0

## 2020-09-09 MED ORDER — LIDOCAINE VISCOUS HCL 2 % MT SOLN
OROMUCOSAL | 12 refills | Status: DC
  Filled 2020-09-09: qty 240, 2d supply, fill #0
  Filled 2020-12-09: qty 240, 2d supply, fill #1
  Filled 2021-01-05: qty 240, 2d supply, fill #2
  Filled 2021-03-11: qty 240, 2d supply, fill #3
  Filled 2021-05-23: qty 240, 2d supply, fill #4

## 2020-09-10 ENCOUNTER — Other Ambulatory Visit (HOSPITAL_COMMUNITY): Payer: Self-pay

## 2020-09-11 ENCOUNTER — Other Ambulatory Visit (HOSPITAL_COMMUNITY): Payer: Self-pay

## 2020-09-15 ENCOUNTER — Other Ambulatory Visit (HOSPITAL_COMMUNITY): Payer: Self-pay

## 2020-09-16 ENCOUNTER — Other Ambulatory Visit (HOSPITAL_BASED_OUTPATIENT_CLINIC_OR_DEPARTMENT_OTHER): Payer: Self-pay

## 2020-09-16 ENCOUNTER — Other Ambulatory Visit (HOSPITAL_COMMUNITY): Payer: Self-pay

## 2020-09-20 ENCOUNTER — Other Ambulatory Visit (HOSPITAL_COMMUNITY): Payer: Self-pay

## 2020-09-21 ENCOUNTER — Other Ambulatory Visit (HOSPITAL_COMMUNITY): Payer: Self-pay

## 2020-09-22 ENCOUNTER — Other Ambulatory Visit (HOSPITAL_COMMUNITY): Payer: Self-pay

## 2020-09-24 ENCOUNTER — Other Ambulatory Visit (HOSPITAL_COMMUNITY): Payer: Self-pay

## 2020-09-24 MED ORDER — GABAPENTIN 300 MG PO CAPS
ORAL_CAPSULE | ORAL | 2 refills | Status: DC
  Filled 2020-09-24 – 2020-10-05 (×2): qty 90, 30d supply, fill #0
  Filled 2020-11-11: qty 90, 30d supply, fill #1
  Filled 2021-01-05: qty 90, 30d supply, fill #2

## 2020-09-28 ENCOUNTER — Other Ambulatory Visit (HOSPITAL_COMMUNITY): Payer: Self-pay

## 2020-10-02 ENCOUNTER — Other Ambulatory Visit (HOSPITAL_COMMUNITY): Payer: Self-pay

## 2020-10-05 ENCOUNTER — Other Ambulatory Visit (HOSPITAL_COMMUNITY): Payer: Self-pay

## 2020-10-06 ENCOUNTER — Other Ambulatory Visit (HOSPITAL_COMMUNITY): Payer: Self-pay

## 2020-10-06 MED ORDER — DICYCLOMINE HCL 10 MG PO CAPS
10.0000 mg | ORAL_CAPSULE | Freq: Three times a day (TID) | ORAL | 5 refills | Status: DC
  Filled 2020-10-06: qty 120, 30d supply, fill #0
  Filled 2020-11-11 – 2021-01-05 (×2): qty 120, 30d supply, fill #1
  Filled 2021-02-07 – 2021-03-11 (×2): qty 120, 30d supply, fill #2
  Filled 2021-05-07: qty 120, 30d supply, fill #3
  Filled 2021-06-27: qty 120, 30d supply, fill #4

## 2020-10-07 ENCOUNTER — Other Ambulatory Visit (HOSPITAL_COMMUNITY): Payer: Self-pay

## 2020-10-11 ENCOUNTER — Other Ambulatory Visit (HOSPITAL_COMMUNITY): Payer: Self-pay

## 2020-10-12 ENCOUNTER — Other Ambulatory Visit (HOSPITAL_COMMUNITY): Payer: Self-pay

## 2020-10-12 MED ORDER — SERTRALINE HCL 50 MG PO TABS
50.0000 mg | ORAL_TABLET | Freq: Every day | ORAL | 1 refills | Status: DC
  Filled 2020-10-12: qty 90, 90d supply, fill #0
  Filled 2021-07-16: qty 90, 90d supply, fill #1

## 2020-10-12 MED ORDER — HYDROCODONE-ACETAMINOPHEN 10-325 MG PO TABS
ORAL_TABLET | ORAL | 0 refills | Status: DC
  Filled 2020-10-12: qty 60, 30d supply, fill #0

## 2020-10-18 ENCOUNTER — Other Ambulatory Visit (HOSPITAL_COMMUNITY): Payer: Self-pay

## 2020-10-22 ENCOUNTER — Other Ambulatory Visit (HOSPITAL_BASED_OUTPATIENT_CLINIC_OR_DEPARTMENT_OTHER): Payer: Self-pay

## 2020-10-28 ENCOUNTER — Other Ambulatory Visit (HOSPITAL_COMMUNITY): Payer: Self-pay

## 2020-10-28 MED ORDER — NA SULFATE-K SULFATE-MG SULF 17.5-3.13-1.6 GM/177ML PO SOLN
ORAL | 0 refills | Status: DC
  Filled 2020-10-28: qty 354, 1d supply, fill #0

## 2020-11-02 ENCOUNTER — Other Ambulatory Visit (HOSPITAL_COMMUNITY): Payer: Self-pay

## 2020-11-11 ENCOUNTER — Other Ambulatory Visit (HOSPITAL_COMMUNITY): Payer: Self-pay

## 2020-11-11 MED ORDER — HYDROCODONE-ACETAMINOPHEN 10-325 MG PO TABS
ORAL_TABLET | ORAL | 0 refills | Status: DC
  Filled 2020-11-11: qty 60, 30d supply, fill #0

## 2020-11-11 MED ORDER — CLOPIDOGREL BISULFATE 75 MG PO TABS
75.0000 mg | ORAL_TABLET | Freq: Every day | ORAL | 5 refills | Status: DC
  Filled 2020-11-11: qty 30, 30d supply, fill #0
  Filled 2021-01-05: qty 30, 30d supply, fill #1
  Filled 2021-02-07: qty 30, 30d supply, fill #2
  Filled 2021-03-14: qty 30, 30d supply, fill #3
  Filled 2021-04-13: qty 30, 30d supply, fill #4

## 2020-11-11 MED ORDER — CARVEDILOL 6.25 MG PO TABS
ORAL_TABLET | ORAL | 5 refills | Status: DC
  Filled 2020-11-11: qty 60, 30d supply, fill #0
  Filled 2020-12-21: qty 60, 30d supply, fill #1
  Filled 2021-01-18: qty 60, 30d supply, fill #2

## 2020-11-11 MED ORDER — NITROGLYCERIN 0.4 MG SL SUBL
SUBLINGUAL_TABLET | SUBLINGUAL | 1 refills | Status: DC
  Filled 2020-11-11: qty 25, 30d supply, fill #0
  Filled 2021-01-05: qty 25, 30d supply, fill #1

## 2020-11-11 MED ORDER — ATORVASTATIN CALCIUM 80 MG PO TABS
80.0000 mg | ORAL_TABLET | Freq: Every day | ORAL | 5 refills | Status: DC
  Filled 2020-11-11: qty 30, 30d supply, fill #0
  Filled 2021-01-05: qty 30, 30d supply, fill #1
  Filled 2021-02-07: qty 30, 30d supply, fill #2
  Filled 2021-03-14: qty 30, 30d supply, fill #3
  Filled 2021-04-13: qty 30, 30d supply, fill #4

## 2020-11-12 ENCOUNTER — Other Ambulatory Visit (HOSPITAL_COMMUNITY): Payer: Self-pay

## 2020-11-13 ENCOUNTER — Other Ambulatory Visit (HOSPITAL_COMMUNITY): Payer: Self-pay

## 2020-11-15 ENCOUNTER — Other Ambulatory Visit (HOSPITAL_COMMUNITY): Payer: Self-pay

## 2020-11-15 MED ORDER — SERTRALINE HCL 50 MG PO TABS
50.0000 mg | ORAL_TABLET | Freq: Every day | ORAL | 1 refills | Status: DC
  Filled 2020-11-15 – 2021-01-05 (×2): qty 90, 90d supply, fill #0
  Filled 2021-04-22: qty 90, 90d supply, fill #1

## 2020-11-16 ENCOUNTER — Other Ambulatory Visit (HOSPITAL_COMMUNITY): Payer: Self-pay

## 2020-11-17 ENCOUNTER — Other Ambulatory Visit (HOSPITAL_COMMUNITY): Payer: Self-pay

## 2020-11-18 ENCOUNTER — Other Ambulatory Visit (HOSPITAL_COMMUNITY): Payer: Self-pay

## 2020-11-19 ENCOUNTER — Other Ambulatory Visit (HOSPITAL_COMMUNITY): Payer: Self-pay

## 2020-11-22 ENCOUNTER — Other Ambulatory Visit (HOSPITAL_COMMUNITY): Payer: Self-pay

## 2020-11-23 ENCOUNTER — Other Ambulatory Visit (HOSPITAL_COMMUNITY): Payer: Self-pay

## 2020-11-24 ENCOUNTER — Other Ambulatory Visit (HOSPITAL_COMMUNITY): Payer: Self-pay

## 2020-11-26 ENCOUNTER — Other Ambulatory Visit (HOSPITAL_COMMUNITY): Payer: Self-pay

## 2020-11-26 MED ORDER — LOSARTAN POTASSIUM 25 MG PO TABS
25.0000 mg | ORAL_TABLET | Freq: Every day | ORAL | 1 refills | Status: DC
  Filled 2020-11-26: qty 90, 90d supply, fill #0
  Filled 2021-03-11: qty 90, 90d supply, fill #1

## 2020-12-02 ENCOUNTER — Other Ambulatory Visit (HOSPITAL_COMMUNITY): Payer: Self-pay

## 2020-12-03 ENCOUNTER — Other Ambulatory Visit (HOSPITAL_COMMUNITY): Payer: Self-pay

## 2020-12-04 ENCOUNTER — Other Ambulatory Visit (HOSPITAL_COMMUNITY): Payer: Self-pay

## 2020-12-08 ENCOUNTER — Other Ambulatory Visit (HOSPITAL_COMMUNITY): Payer: Self-pay

## 2020-12-09 ENCOUNTER — Other Ambulatory Visit (HOSPITAL_COMMUNITY): Payer: Self-pay

## 2020-12-09 MED ORDER — HYDROCODONE-ACETAMINOPHEN 10-325 MG PO TABS
ORAL_TABLET | ORAL | 0 refills | Status: DC
  Filled 2020-12-09: qty 60, 30d supply, fill #0

## 2020-12-10 ENCOUNTER — Other Ambulatory Visit (HOSPITAL_COMMUNITY): Payer: Self-pay

## 2020-12-13 ENCOUNTER — Other Ambulatory Visit (HOSPITAL_COMMUNITY): Payer: Self-pay

## 2020-12-17 ENCOUNTER — Other Ambulatory Visit (HOSPITAL_COMMUNITY): Payer: Self-pay

## 2020-12-18 ENCOUNTER — Other Ambulatory Visit (HOSPITAL_COMMUNITY): Payer: Self-pay

## 2020-12-20 ENCOUNTER — Other Ambulatory Visit (HOSPITAL_COMMUNITY): Payer: Self-pay

## 2020-12-20 MED ORDER — MONTELUKAST SODIUM 10 MG PO TABS
ORAL_TABLET | ORAL | 3 refills | Status: DC
  Filled 2020-12-20: qty 30, 30d supply, fill #0

## 2020-12-21 ENCOUNTER — Other Ambulatory Visit (HOSPITAL_COMMUNITY): Payer: Self-pay

## 2020-12-21 MED ORDER — SPIRONOLACTONE 25 MG PO TABS
12.5000 mg | ORAL_TABLET | Freq: Every day | ORAL | 2 refills | Status: DC
  Filled 2020-12-21: qty 15, 30d supply, fill #0
  Filled 2021-01-18: qty 15, 30d supply, fill #1

## 2020-12-23 ENCOUNTER — Other Ambulatory Visit (HOSPITAL_COMMUNITY): Payer: Self-pay

## 2021-01-05 ENCOUNTER — Other Ambulatory Visit (HOSPITAL_COMMUNITY): Payer: Self-pay

## 2021-01-05 MED ORDER — MONTELUKAST SODIUM 10 MG PO TABS
ORAL_TABLET | ORAL | 3 refills | Status: DC
  Filled 2021-01-05 – 2021-01-18 (×2): qty 30, 30d supply, fill #0
  Filled 2021-11-02: qty 30, 30d supply, fill #1
  Filled 2021-12-02: qty 30, 30d supply, fill #2

## 2021-01-11 ENCOUNTER — Other Ambulatory Visit (HOSPITAL_COMMUNITY): Payer: Self-pay

## 2021-01-11 MED ORDER — ALBUTEROL SULFATE HFA 108 (90 BASE) MCG/ACT IN AERS
INHALATION_SPRAY | RESPIRATORY_TRACT | 6 refills | Status: DC
  Filled 2021-01-11: qty 18, 25d supply, fill #0
  Filled 2021-03-11: qty 18, 25d supply, fill #1
  Filled 2021-04-13: qty 18, 25d supply, fill #2

## 2021-01-11 MED ORDER — HYDROCODONE-ACETAMINOPHEN 10-325 MG PO TABS
ORAL_TABLET | ORAL | 0 refills | Status: DC
  Filled 2021-01-11: qty 60, 30d supply, fill #0

## 2021-01-11 MED ORDER — GABAPENTIN 300 MG PO CAPS
ORAL_CAPSULE | ORAL | 2 refills | Status: DC
  Filled 2021-01-11 – 2021-03-11 (×2): qty 90, 30d supply, fill #0
  Filled 2021-04-13: qty 90, 30d supply, fill #1
  Filled 2021-05-23: qty 90, 30d supply, fill #2

## 2021-01-18 ENCOUNTER — Other Ambulatory Visit (HOSPITAL_COMMUNITY): Payer: Self-pay

## 2021-02-07 ENCOUNTER — Other Ambulatory Visit (HOSPITAL_COMMUNITY): Payer: Self-pay

## 2021-02-08 ENCOUNTER — Other Ambulatory Visit (HOSPITAL_COMMUNITY): Payer: Self-pay

## 2021-02-10 ENCOUNTER — Other Ambulatory Visit (HOSPITAL_COMMUNITY): Payer: Self-pay

## 2021-02-14 ENCOUNTER — Other Ambulatory Visit (HOSPITAL_COMMUNITY): Payer: Self-pay

## 2021-02-14 MED ORDER — SPIRONOLACTONE 25 MG PO TABS
ORAL_TABLET | ORAL | 5 refills | Status: DC
  Filled 2021-02-14: qty 15, 30d supply, fill #0
  Filled 2021-03-11: qty 15, 30d supply, fill #1
  Filled 2021-04-22: qty 15, 30d supply, fill #2
  Filled 2021-05-23: qty 15, 30d supply, fill #3
  Filled 2021-06-27: qty 15, 30d supply, fill #4
  Filled 2021-08-01: qty 15, 30d supply, fill #5

## 2021-02-14 MED ORDER — MONTELUKAST SODIUM 10 MG PO TABS
ORAL_TABLET | ORAL | 5 refills | Status: DC
  Filled 2021-02-14: qty 30, 30d supply, fill #0
  Filled 2021-03-11: qty 30, 30d supply, fill #1
  Filled 2021-04-22: qty 30, 30d supply, fill #2
  Filled 2021-05-23: qty 30, 30d supply, fill #3
  Filled 2021-06-27: qty 30, 30d supply, fill #4
  Filled 2021-08-01: qty 30, 30d supply, fill #5

## 2021-02-14 MED ORDER — CARVEDILOL 6.25 MG PO TABS
ORAL_TABLET | ORAL | 5 refills | Status: DC
  Filled 2021-02-14: qty 60, 30d supply, fill #0
  Filled 2021-04-02: qty 60, 30d supply, fill #1
  Filled 2021-05-07: qty 60, 30d supply, fill #2
  Filled 2021-06-27: qty 60, 30d supply, fill #3
  Filled 2021-08-01: qty 60, 30d supply, fill #4
  Filled 2021-09-07: qty 60, 30d supply, fill #5

## 2021-02-14 MED ORDER — ATORVASTATIN CALCIUM 80 MG PO TABS
80.0000 mg | ORAL_TABLET | Freq: Every day | ORAL | 5 refills | Status: DC
  Filled 2021-02-14 – 2021-05-23 (×2): qty 30, 30d supply, fill #0
  Filled 2021-06-27: qty 30, 30d supply, fill #1
  Filled 2021-08-01: qty 30, 30d supply, fill #2
  Filled 2021-09-07: qty 30, 30d supply, fill #3
  Filled 2021-10-08: qty 30, 30d supply, fill #4
  Filled 2021-11-02: qty 30, 30d supply, fill #5

## 2021-02-14 MED ORDER — HYDROCODONE-ACETAMINOPHEN 10-325 MG PO TABS
ORAL_TABLET | ORAL | 0 refills | Status: DC
  Filled 2021-02-14: qty 60, 30d supply, fill #0

## 2021-02-14 MED ORDER — CLOPIDOGREL BISULFATE 75 MG PO TABS
75.0000 mg | ORAL_TABLET | Freq: Every day | ORAL | 5 refills | Status: DC
  Filled 2021-02-14 – 2021-05-23 (×2): qty 30, 30d supply, fill #0
  Filled 2021-06-27: qty 30, 30d supply, fill #1
  Filled 2021-08-12: qty 30, 30d supply, fill #2
  Filled 2021-09-07: qty 30, 30d supply, fill #3
  Filled 2021-10-08: qty 30, 30d supply, fill #4
  Filled 2021-12-02: qty 30, 30d supply, fill #5

## 2021-02-15 ENCOUNTER — Other Ambulatory Visit (HOSPITAL_COMMUNITY): Payer: Self-pay

## 2021-02-23 ENCOUNTER — Other Ambulatory Visit (HOSPITAL_COMMUNITY): Payer: Self-pay

## 2021-02-24 ENCOUNTER — Other Ambulatory Visit (HOSPITAL_COMMUNITY): Payer: Self-pay

## 2021-02-28 ENCOUNTER — Other Ambulatory Visit (HOSPITAL_COMMUNITY): Payer: Self-pay

## 2021-02-28 MED ORDER — HYDROCODONE-ACETAMINOPHEN 10-325 MG PO TABS
ORAL_TABLET | ORAL | 0 refills | Status: DC
  Filled 2021-03-14: qty 60, 30d supply, fill #0

## 2021-03-11 ENCOUNTER — Other Ambulatory Visit (HOSPITAL_COMMUNITY): Payer: Self-pay

## 2021-03-14 ENCOUNTER — Other Ambulatory Visit (HOSPITAL_COMMUNITY): Payer: Self-pay

## 2021-04-02 ENCOUNTER — Other Ambulatory Visit (HOSPITAL_COMMUNITY): Payer: Self-pay

## 2021-04-02 MED ORDER — HYDROCODONE-ACETAMINOPHEN 10-325 MG PO TABS
1.0000 | ORAL_TABLET | Freq: Two times a day (BID) | ORAL | 0 refills | Status: DC
  Filled 2021-04-02 – 2021-04-13 (×2): qty 60, 30d supply, fill #0

## 2021-04-02 MED ORDER — CEFDINIR 300 MG PO CAPS
300.0000 mg | ORAL_CAPSULE | Freq: Two times a day (BID) | ORAL | 0 refills | Status: DC
  Filled 2021-04-02: qty 14, 7d supply, fill #0

## 2021-04-02 MED ORDER — GABAPENTIN 300 MG PO CAPS
300.0000 mg | ORAL_CAPSULE | Freq: Three times a day (TID) | ORAL | 2 refills | Status: DC
  Filled 2021-04-02 – 2021-12-02 (×2): qty 90, 30d supply, fill #0
  Filled 2022-01-13: qty 90, 30d supply, fill #1

## 2021-04-09 ENCOUNTER — Other Ambulatory Visit (HOSPITAL_COMMUNITY): Payer: Self-pay

## 2021-04-09 MED ORDER — IBUPROFEN 800 MG PO TABS
800.0000 mg | ORAL_TABLET | Freq: Three times a day (TID) | ORAL | 0 refills | Status: DC
  Filled 2021-04-09: qty 21, 7d supply, fill #0

## 2021-04-09 MED ORDER — METHOCARBAMOL 500 MG PO TABS
500.0000 mg | ORAL_TABLET | Freq: Three times a day (TID) | ORAL | 0 refills | Status: DC
  Filled 2021-04-09: qty 90, 30d supply, fill #0

## 2021-04-13 ENCOUNTER — Other Ambulatory Visit (HOSPITAL_COMMUNITY): Payer: Self-pay

## 2021-04-22 ENCOUNTER — Other Ambulatory Visit (HOSPITAL_COMMUNITY): Payer: Self-pay

## 2021-05-07 ENCOUNTER — Other Ambulatory Visit (HOSPITAL_COMMUNITY): Payer: Self-pay

## 2021-05-12 ENCOUNTER — Other Ambulatory Visit (HOSPITAL_COMMUNITY): Payer: Self-pay

## 2021-05-13 ENCOUNTER — Other Ambulatory Visit (HOSPITAL_COMMUNITY): Payer: Self-pay

## 2021-05-14 ENCOUNTER — Other Ambulatory Visit (HOSPITAL_COMMUNITY): Payer: Self-pay

## 2021-05-16 ENCOUNTER — Other Ambulatory Visit (HOSPITAL_COMMUNITY): Payer: Self-pay

## 2021-05-16 MED ORDER — HYDROCODONE-ACETAMINOPHEN 10-325 MG PO TABS
ORAL_TABLET | ORAL | 0 refills | Status: DC
  Filled 2021-05-16: qty 60, 30d supply, fill #0

## 2021-05-23 ENCOUNTER — Other Ambulatory Visit (HOSPITAL_COMMUNITY): Payer: Self-pay

## 2021-06-03 ENCOUNTER — Other Ambulatory Visit (HOSPITAL_COMMUNITY): Payer: Self-pay

## 2021-06-03 MED ORDER — EPINEPHRINE 0.3 MG/0.3ML IJ SOAJ
INTRAMUSCULAR | 2 refills | Status: DC
  Filled 2021-06-03: qty 2, 30d supply, fill #0

## 2021-06-03 MED ORDER — HYDROCODONE-ACETAMINOPHEN 10-325 MG PO TABS
1.0000 | ORAL_TABLET | Freq: Two times a day (BID) | ORAL | 0 refills | Status: DC
  Filled 2021-06-10 – 2021-06-13 (×2): qty 60, 30d supply, fill #0

## 2021-06-03 MED ORDER — CARVEDILOL 6.25 MG PO TABS
6.2500 mg | ORAL_TABLET | Freq: Two times a day (BID) | ORAL | 5 refills | Status: DC
  Filled 2021-06-03: qty 60, 30d supply, fill #0
  Filled 2021-10-08: qty 60, 30d supply, fill #1
  Filled 2021-12-02: qty 60, 30d supply, fill #2
  Filled 2022-01-13: qty 60, 30d supply, fill #3
  Filled 2022-02-14 (×2): qty 60, 30d supply, fill #4

## 2021-06-03 MED ORDER — BACLOFEN 10 MG PO TABS
10.0000 mg | ORAL_TABLET | Freq: Three times a day (TID) | ORAL | 3 refills | Status: DC | PRN
  Filled 2021-06-03: qty 60, 20d supply, fill #0

## 2021-06-03 MED ORDER — SUCRALFATE 1 G PO TABS
ORAL_TABLET | ORAL | 2 refills | Status: DC
  Filled 2021-06-03: qty 120, 40d supply, fill #0

## 2021-06-10 ENCOUNTER — Other Ambulatory Visit (HOSPITAL_COMMUNITY): Payer: Self-pay

## 2021-06-13 ENCOUNTER — Other Ambulatory Visit (HOSPITAL_COMMUNITY): Payer: Self-pay

## 2021-06-27 ENCOUNTER — Other Ambulatory Visit (HOSPITAL_COMMUNITY): Payer: Self-pay

## 2021-06-28 ENCOUNTER — Other Ambulatory Visit (HOSPITAL_COMMUNITY): Payer: Self-pay

## 2021-06-28 MED ORDER — LOSARTAN POTASSIUM 25 MG PO TABS
ORAL_TABLET | ORAL | 1 refills | Status: DC
  Filled 2021-06-28 – 2021-08-12 (×2): qty 90, 90d supply, fill #0
  Filled 2021-11-01: qty 90, 90d supply, fill #1

## 2021-06-28 MED ORDER — GABAPENTIN 300 MG PO CAPS
ORAL_CAPSULE | ORAL | 2 refills | Status: DC
  Filled 2021-06-28 – 2021-08-12 (×2): qty 90, 30d supply, fill #0
  Filled 2021-09-07: qty 90, 30d supply, fill #1
  Filled 2021-10-08: qty 90, 30d supply, fill #2

## 2021-07-01 ENCOUNTER — Other Ambulatory Visit (HOSPITAL_COMMUNITY): Payer: Self-pay

## 2021-07-01 MED ORDER — HYDROCODONE-ACETAMINOPHEN 10-325 MG PO TABS
ORAL_TABLET | ORAL | 0 refills | Status: DC
  Filled 2021-07-16: qty 60, 30d supply, fill #0

## 2021-07-09 ENCOUNTER — Other Ambulatory Visit (HOSPITAL_COMMUNITY): Payer: Self-pay

## 2021-07-16 ENCOUNTER — Other Ambulatory Visit (HOSPITAL_COMMUNITY): Payer: Self-pay

## 2021-08-01 ENCOUNTER — Other Ambulatory Visit (HOSPITAL_COMMUNITY): Payer: Self-pay

## 2021-08-12 ENCOUNTER — Other Ambulatory Visit (HOSPITAL_COMMUNITY): Payer: Self-pay

## 2021-08-13 ENCOUNTER — Other Ambulatory Visit (HOSPITAL_COMMUNITY): Payer: Self-pay

## 2021-08-18 ENCOUNTER — Other Ambulatory Visit (HOSPITAL_COMMUNITY): Payer: Self-pay

## 2021-08-19 ENCOUNTER — Other Ambulatory Visit (HOSPITAL_COMMUNITY): Payer: Self-pay

## 2021-08-19 MED ORDER — HYDROCODONE-ACETAMINOPHEN 10-325 MG PO TABS
ORAL_TABLET | ORAL | 0 refills | Status: DC
  Filled 2021-08-19: qty 60, 30d supply, fill #0

## 2021-08-20 ENCOUNTER — Other Ambulatory Visit (HOSPITAL_COMMUNITY): Payer: Self-pay

## 2021-08-26 ENCOUNTER — Other Ambulatory Visit (HOSPITAL_COMMUNITY): Payer: Self-pay

## 2021-09-07 ENCOUNTER — Other Ambulatory Visit (HOSPITAL_COMMUNITY): Payer: Self-pay

## 2021-09-08 ENCOUNTER — Other Ambulatory Visit (HOSPITAL_COMMUNITY): Payer: Self-pay

## 2021-09-08 MED ORDER — SPIRONOLACTONE 25 MG PO TABS
12.5000 mg | ORAL_TABLET | Freq: Every day | ORAL | 5 refills | Status: DC
  Filled 2021-09-08: qty 15, 30d supply, fill #0
  Filled 2021-10-08: qty 15, 30d supply, fill #1
  Filled 2021-11-02: qty 15, 30d supply, fill #2
  Filled 2021-12-02: qty 15, 30d supply, fill #3
  Filled 2022-01-13: qty 15, 30d supply, fill #4
  Filled 2022-02-14: qty 15, 30d supply, fill #5

## 2021-09-08 MED ORDER — MONTELUKAST SODIUM 10 MG PO TABS
10.0000 mg | ORAL_TABLET | Freq: Every day | ORAL | 5 refills | Status: DC
  Filled 2021-09-08: qty 30, 30d supply, fill #0
  Filled 2021-10-08: qty 30, 30d supply, fill #1
  Filled 2022-01-13: qty 30, 30d supply, fill #2
  Filled 2022-02-14: qty 30, 30d supply, fill #3
  Filled 2022-03-25: qty 30, 30d supply, fill #4
  Filled 2022-06-03: qty 30, 30d supply, fill #5

## 2021-09-09 ENCOUNTER — Other Ambulatory Visit (HOSPITAL_COMMUNITY): Payer: Self-pay

## 2021-09-09 MED ORDER — HYDROCODONE-ACETAMINOPHEN 10-325 MG PO TABS
1.0000 | ORAL_TABLET | Freq: Two times a day (BID) | ORAL | 0 refills | Status: DC
  Filled 2021-09-23 – 2021-09-24 (×2): qty 60, 30d supply, fill #0

## 2021-09-13 ENCOUNTER — Other Ambulatory Visit (HOSPITAL_COMMUNITY): Payer: Self-pay

## 2021-09-21 ENCOUNTER — Emergency Department (HOSPITAL_COMMUNITY)
Admission: EM | Admit: 2021-09-21 | Discharge: 2021-09-21 | Disposition: A | Discharge status: Home / Self Care | Payer: Medicare HMO | Attending: Emergency Medicine | Admitting: Emergency Medicine

## 2021-09-21 ENCOUNTER — Encounter (HOSPITAL_COMMUNITY): Payer: Self-pay | Admitting: Emergency Medicine

## 2021-09-21 DIAGNOSIS — Z7982 Long term (current) use of aspirin: Secondary | ICD-10-CM | POA: Diagnosis not present

## 2021-09-21 DIAGNOSIS — S81831A Puncture wound without foreign body, right lower leg, initial encounter: Secondary | ICD-10-CM | POA: Insufficient documentation

## 2021-09-21 DIAGNOSIS — W260XXA Contact with knife, initial encounter: Secondary | ICD-10-CM | POA: Diagnosis not present

## 2021-09-21 DIAGNOSIS — Z7901 Long term (current) use of anticoagulants: Secondary | ICD-10-CM | POA: Insufficient documentation

## 2021-09-21 DIAGNOSIS — S8991XA Unspecified injury of right lower leg, initial encounter: Secondary | ICD-10-CM | POA: Diagnosis present

## 2021-09-21 DIAGNOSIS — Z23 Encounter for immunization: Secondary | ICD-10-CM | POA: Diagnosis not present

## 2021-09-21 MED ORDER — TETANUS-DIPHTH-ACELL PERTUSSIS 5-2.5-18.5 LF-MCG/0.5 IM SUSY
0.5000 mL | PREFILLED_SYRINGE | Freq: Once | INTRAMUSCULAR | Status: AC
  Administered 2021-09-21: 0.5 mL via INTRAMUSCULAR
  Filled 2021-09-21: qty 0.5

## 2021-09-21 NOTE — ED Notes (Signed)
Pt verbalized understanding of d/c instructions, follow up and wound care provided. Pt ambulatory to [facility] with steady gait. NAD.

## 2021-09-21 NOTE — ED Notes (Signed)
Wound cleaned, dressed with bacitracin telfa 4x4 and secured with hypafix tape. Wound care instructions provided with d/c instructions.

## 2021-09-21 NOTE — Discharge Instructions (Signed)
Keep dressing on the wound and monitor for any signs of infection.
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
## 2021-09-23 ENCOUNTER — Other Ambulatory Visit (HOSPITAL_COMMUNITY): Payer: Self-pay

## 2021-09-24 ENCOUNTER — Other Ambulatory Visit (HOSPITAL_COMMUNITY): Payer: Self-pay

## 2021-09-26 ENCOUNTER — Other Ambulatory Visit (HOSPITAL_COMMUNITY): Payer: Self-pay

## 2021-09-26 MED ORDER — CEFDINIR 300 MG PO CAPS
300.0000 mg | ORAL_CAPSULE | Freq: Two times a day (BID) | ORAL | 0 refills | Status: DC
  Filled 2021-09-26: qty 10, 5d supply, fill #0

## 2021-09-26 MED ORDER — DICYCLOMINE HCL 10 MG PO CAPS
10.0000 mg | ORAL_CAPSULE | Freq: Three times a day (TID) | ORAL | 5 refills | Status: DC
  Filled 2021-09-26: qty 120, 30d supply, fill #0
  Filled 2021-11-02: qty 120, 30d supply, fill #1
  Filled 2021-12-02: qty 120, 30d supply, fill #2
  Filled 2022-04-05: qty 120, 30d supply, fill #3
  Filled 2022-05-06: qty 120, 30d supply, fill #4
  Filled 2022-07-05: qty 120, 30d supply, fill #5

## 2021-10-08 ENCOUNTER — Other Ambulatory Visit (HOSPITAL_COMMUNITY): Payer: Self-pay

## 2021-10-11 ENCOUNTER — Other Ambulatory Visit (HOSPITAL_COMMUNITY): Payer: Self-pay

## 2021-10-14 ENCOUNTER — Other Ambulatory Visit (HOSPITAL_COMMUNITY): Payer: Self-pay

## 2021-10-14 MED ORDER — LIDOCAINE VISCOUS HCL 2 % MT SOLN
OROMUCOSAL | 99 refills | Status: DC
  Filled 2021-10-14: qty 480, 3d supply, fill #0

## 2021-10-24 ENCOUNTER — Other Ambulatory Visit (HOSPITAL_COMMUNITY): Payer: Self-pay

## 2021-10-25 ENCOUNTER — Other Ambulatory Visit (HOSPITAL_COMMUNITY): Payer: Self-pay

## 2021-11-01 ENCOUNTER — Other Ambulatory Visit (HOSPITAL_COMMUNITY): Payer: Self-pay

## 2021-11-02 ENCOUNTER — Other Ambulatory Visit (HOSPITAL_COMMUNITY): Payer: Self-pay

## 2021-11-02 MED ORDER — SERTRALINE HCL 50 MG PO TABS
50.0000 mg | ORAL_TABLET | Freq: Every day | ORAL | 1 refills | Status: DC
  Filled 2021-11-02: qty 90, 90d supply, fill #0
  Filled 2022-02-14: qty 90, 90d supply, fill #1

## 2021-11-02 MED ORDER — HYDROCODONE-ACETAMINOPHEN 10-325 MG PO TABS
1.0000 | ORAL_TABLET | Freq: Two times a day (BID) | ORAL | 0 refills | Status: DC
  Filled 2021-11-25: qty 60, 30d supply, fill #0

## 2021-11-04 ENCOUNTER — Other Ambulatory Visit (HOSPITAL_COMMUNITY): Payer: Self-pay

## 2021-11-04 MED ORDER — SERTRALINE HCL 50 MG PO TABS
50.0000 mg | ORAL_TABLET | Freq: Every day | ORAL | 1 refills | Status: DC
  Filled 2021-11-04 – 2022-05-06 (×2): qty 90, 90d supply, fill #0
  Filled 2022-08-26: qty 90, 90d supply, fill #1

## 2021-11-25 ENCOUNTER — Other Ambulatory Visit (HOSPITAL_COMMUNITY): Payer: Self-pay

## 2021-11-26 ENCOUNTER — Other Ambulatory Visit (HOSPITAL_COMMUNITY): Payer: Self-pay

## 2021-11-28 ENCOUNTER — Other Ambulatory Visit (HOSPITAL_COMMUNITY): Payer: Self-pay

## 2021-11-28 MED ORDER — PREDNISOLONE ACETATE 1 % OP SUSP
OPHTHALMIC | 5 refills | Status: DC
  Filled 2021-11-28: qty 5, 28d supply, fill #0

## 2021-11-28 MED ORDER — OFLOXACIN 0.3 % OP SOLN
OPHTHALMIC | 1 refills | Status: DC
  Filled 2021-11-28: qty 5, 7d supply, fill #0

## 2021-12-02 ENCOUNTER — Other Ambulatory Visit (HOSPITAL_COMMUNITY): Payer: Self-pay

## 2021-12-02 MED ORDER — ATORVASTATIN CALCIUM 80 MG PO TABS
80.0000 mg | ORAL_TABLET | Freq: Every day | ORAL | 5 refills | Status: DC
  Filled 2021-12-02: qty 30, 30d supply, fill #0
  Filled 2022-01-13: qty 30, 30d supply, fill #1
  Filled 2022-02-14: qty 30, 30d supply, fill #2
  Filled 2022-08-07: qty 30, 30d supply, fill #3
  Filled 2022-09-12: qty 30, 30d supply, fill #4

## 2021-12-02 MED ORDER — NITROGLYCERIN 0.4 MG SL SUBL
SUBLINGUAL_TABLET | SUBLINGUAL | 1 refills | Status: DC
  Filled 2021-12-02: qty 25, 30d supply, fill #0

## 2021-12-29 ENCOUNTER — Other Ambulatory Visit (HOSPITAL_COMMUNITY): Payer: Self-pay

## 2021-12-29 MED ORDER — PREDNISOLONE ACETATE 1 % OP SUSP
1.0000 [drp] | Freq: Four times a day (QID) | OPHTHALMIC | 2 refills | Status: DC
  Filled 2021-12-29: qty 5, 20d supply, fill #0

## 2021-12-29 MED ORDER — OFLOXACIN 0.3 % OP SOLN
1.0000 [drp] | Freq: Four times a day (QID) | OPHTHALMIC | 1 refills | Status: DC
  Filled 2021-12-29: qty 5, 25d supply, fill #0

## 2021-12-30 ENCOUNTER — Other Ambulatory Visit (HOSPITAL_COMMUNITY): Payer: Self-pay

## 2022-01-04 ENCOUNTER — Other Ambulatory Visit (HOSPITAL_COMMUNITY): Payer: Self-pay

## 2022-01-06 ENCOUNTER — Other Ambulatory Visit (HOSPITAL_COMMUNITY): Payer: Self-pay

## 2022-01-07 ENCOUNTER — Other Ambulatory Visit (HOSPITAL_COMMUNITY): Payer: Self-pay

## 2022-01-07 MED ORDER — HYDROCODONE-ACETAMINOPHEN 10-325 MG PO TABS
1.0000 | ORAL_TABLET | Freq: Two times a day (BID) | ORAL | 0 refills | Status: DC
  Filled 2022-01-07: qty 60, 30d supply, fill #0

## 2022-01-10 ENCOUNTER — Other Ambulatory Visit (HOSPITAL_COMMUNITY): Payer: Self-pay

## 2022-01-10 MED ORDER — LIDOCAINE VISCOUS HCL 2 % MT SOLN
15.0000 mL | OROMUCOSAL | 99 refills | Status: DC | PRN
  Filled 2022-01-10: qty 480, 2d supply, fill #0
  Filled 2022-08-26: qty 480, 2d supply, fill #1
  Filled 2022-09-30: qty 480, 2d supply, fill #2
  Filled 2022-12-02: qty 480, 2d supply, fill #3

## 2022-01-13 ENCOUNTER — Other Ambulatory Visit (HOSPITAL_COMMUNITY): Payer: Self-pay

## 2022-01-13 ENCOUNTER — Other Ambulatory Visit: Payer: Self-pay

## 2022-01-13 MED ORDER — CLOPIDOGREL BISULFATE 75 MG PO TABS
75.0000 mg | ORAL_TABLET | Freq: Every day | ORAL | 5 refills | Status: DC
  Filled 2022-01-13: qty 30, 30d supply, fill #0
  Filled 2022-09-12: qty 30, 30d supply, fill #1

## 2022-02-02 ENCOUNTER — Other Ambulatory Visit (HOSPITAL_COMMUNITY): Payer: Self-pay

## 2022-02-02 MED ORDER — LOSARTAN POTASSIUM 25 MG PO TABS
25.0000 mg | ORAL_TABLET | Freq: Every day | ORAL | 1 refills | Status: DC
  Filled 2022-02-02: qty 90, 90d supply, fill #0
  Filled 2022-05-06: qty 90, 90d supply, fill #1

## 2022-02-06 ENCOUNTER — Other Ambulatory Visit (HOSPITAL_COMMUNITY): Payer: Self-pay

## 2022-02-07 ENCOUNTER — Other Ambulatory Visit (HOSPITAL_COMMUNITY): Payer: Self-pay

## 2022-02-07 MED ORDER — HYDROCODONE-ACETAMINOPHEN 10-325 MG PO TABS
1.0000 | ORAL_TABLET | Freq: Two times a day (BID) | ORAL | 0 refills | Status: DC
  Filled 2022-02-07: qty 60, 30d supply, fill #0

## 2022-02-08 ENCOUNTER — Other Ambulatory Visit (HOSPITAL_COMMUNITY): Payer: Self-pay

## 2022-02-14 ENCOUNTER — Other Ambulatory Visit (HOSPITAL_COMMUNITY): Payer: Self-pay

## 2022-02-15 ENCOUNTER — Other Ambulatory Visit (HOSPITAL_COMMUNITY): Payer: Self-pay

## 2022-03-18 ENCOUNTER — Other Ambulatory Visit (HOSPITAL_COMMUNITY): Payer: Self-pay

## 2022-03-18 MED ORDER — SUCRALFATE 1 G PO TABS
1.0000 g | ORAL_TABLET | Freq: Three times a day (TID) | ORAL | 1 refills | Status: DC
  Filled 2022-03-18: qty 270, 90d supply, fill #0
  Filled 2022-07-05: qty 270, 90d supply, fill #1

## 2022-03-18 MED ORDER — HYDROCODONE-ACETAMINOPHEN 10-325 MG PO TABS
1.0000 | ORAL_TABLET | Freq: Two times a day (BID) | ORAL | 0 refills | Status: DC
  Filled 2022-03-18: qty 60, 30d supply, fill #0

## 2022-03-18 MED ORDER — SPIRONOLACTONE 25 MG PO TABS
12.5000 mg | ORAL_TABLET | Freq: Every day | ORAL | 1 refills | Status: DC
  Filled 2022-03-18: qty 45, 90d supply, fill #0
  Filled 2022-07-05: qty 45, 90d supply, fill #1

## 2022-03-18 MED ORDER — CARVEDILOL 6.25 MG PO TABS
6.2500 mg | ORAL_TABLET | Freq: Two times a day (BID) | ORAL | 1 refills | Status: DC
  Filled 2022-03-18: qty 180, 90d supply, fill #0
  Filled 2022-07-05: qty 180, 90d supply, fill #1

## 2022-03-25 ENCOUNTER — Other Ambulatory Visit (HOSPITAL_COMMUNITY): Payer: Self-pay

## 2022-04-05 ENCOUNTER — Other Ambulatory Visit (HOSPITAL_COMMUNITY): Payer: Self-pay

## 2022-04-06 ENCOUNTER — Other Ambulatory Visit (HOSPITAL_COMMUNITY): Payer: Self-pay

## 2022-04-06 MED ORDER — GABAPENTIN 300 MG PO CAPS
300.0000 mg | ORAL_CAPSULE | Freq: Three times a day (TID) | ORAL | 2 refills | Status: DC
  Filled 2022-04-06: qty 90, 30d supply, fill #0
  Filled 2022-05-06: qty 90, 30d supply, fill #1
  Filled 2022-07-05: qty 90, 30d supply, fill #2

## 2022-04-07 ENCOUNTER — Other Ambulatory Visit (HOSPITAL_COMMUNITY): Payer: Self-pay

## 2022-04-14 ENCOUNTER — Other Ambulatory Visit (HOSPITAL_COMMUNITY): Payer: Self-pay

## 2022-04-14 MED ORDER — ATORVASTATIN CALCIUM 80 MG PO TABS
80.0000 mg | ORAL_TABLET | Freq: Every day | ORAL | 1 refills | Status: DC
  Filled 2022-04-14: qty 90, 90d supply, fill #0
  Filled 2022-10-07: qty 90, 90d supply, fill #1

## 2022-04-14 MED ORDER — DOXYCYCLINE HYCLATE 100 MG PO CAPS
100.0000 mg | ORAL_CAPSULE | Freq: Two times a day (BID) | ORAL | 0 refills | Status: DC
  Filled 2022-04-14: qty 14, 7d supply, fill #0

## 2022-04-14 MED ORDER — HYDROCODONE-ACETAMINOPHEN 10-325 MG PO TABS
1.0000 | ORAL_TABLET | Freq: Two times a day (BID) | ORAL | 0 refills | Status: DC
  Filled 2022-04-14: qty 60, 30d supply, fill #0

## 2022-04-14 MED ORDER — CLOPIDOGREL BISULFATE 75 MG PO TABS
75.0000 mg | ORAL_TABLET | Freq: Every day | ORAL | 1 refills | Status: DC
  Filled 2022-04-14: qty 90, 90d supply, fill #0
  Filled 2022-11-01: qty 90, 90d supply, fill #1

## 2022-04-18 ENCOUNTER — Emergency Department (HOSPITAL_COMMUNITY): Payer: Medicare HMO

## 2022-04-18 ENCOUNTER — Encounter (HOSPITAL_COMMUNITY): Payer: Self-pay

## 2022-04-18 ENCOUNTER — Other Ambulatory Visit: Payer: Self-pay

## 2022-04-18 ENCOUNTER — Emergency Department (HOSPITAL_COMMUNITY)
Admission: EM | Admit: 2022-04-18 | Discharge: 2022-04-18 | Disposition: A | Discharge status: Home / Self Care | Payer: Medicare HMO | Attending: Emergency Medicine | Admitting: Emergency Medicine

## 2022-04-18 DIAGNOSIS — R079 Chest pain, unspecified: Secondary | ICD-10-CM

## 2022-04-18 DIAGNOSIS — R1012 Left upper quadrant pain: Secondary | ICD-10-CM | POA: Diagnosis not present

## 2022-04-18 DIAGNOSIS — I2510 Atherosclerotic heart disease of native coronary artery without angina pectoris: Secondary | ICD-10-CM | POA: Diagnosis not present

## 2022-04-18 DIAGNOSIS — Z7902 Long term (current) use of antithrombotics/antiplatelets: Secondary | ICD-10-CM | POA: Diagnosis not present

## 2022-04-18 DIAGNOSIS — Z7982 Long term (current) use of aspirin: Secondary | ICD-10-CM | POA: Insufficient documentation

## 2022-04-18 DIAGNOSIS — R072 Precordial pain: Secondary | ICD-10-CM | POA: Insufficient documentation

## 2022-04-18 LAB — CBC
HCT: 42.3 % (ref 39.0–52.0)
Hemoglobin: 14.7 g/dL (ref 13.0–17.0)
MCH: 30.8 pg (ref 26.0–34.0)
MCHC: 34.8 g/dL (ref 30.0–36.0)
MCV: 88.5 fL (ref 80.0–100.0)
Platelets: 282 10*3/uL (ref 150–400)
RBC: 4.78 MIL/uL (ref 4.22–5.81)
RDW: 13.9 % (ref 11.5–15.5)
WBC: 8.5 10*3/uL (ref 4.0–10.5)
nRBC: 0 % (ref 0.0–0.2)

## 2022-04-18 LAB — BASIC METABOLIC PANEL
Anion gap: 13 (ref 5–15)
BUN: 15 mg/dL (ref 8–23)
CO2: 22 mmol/L (ref 22–32)
Calcium: 9.8 mg/dL (ref 8.9–10.3)
Chloride: 103 mmol/L (ref 98–111)
Creatinine, Ser: 1.1 mg/dL (ref 0.61–1.24)
GFR, Estimated: >60 mL/min (ref >60)
Glucose, Bld: 118 mg/dL — ABNORMAL HIGH (ref 70–99)
Potassium: 3.9 mmol/L (ref 3.5–5.1)
Sodium: 138 mmol/L (ref 135–145)

## 2022-04-18 LAB — TROPONIN I (HIGH SENSITIVITY)
Troponin I (High Sensitivity): 4 ng/L (ref <18)
Troponin I (High Sensitivity): 5 ng/L (ref <18)

## 2022-04-18 LAB — D-DIMER, QUANTITATIVE: D-Dimer, Quant: 0.41 ug/mL-FEU (ref 0.00–0.50)

## 2022-04-18 MED ORDER — IOHEXOL 350 MG/ML SOLN
75.0000 mL | Freq: Once | INTRAVENOUS | Status: AC | PRN
  Administered 2022-04-18: 75 mL via INTRAVENOUS

## 2022-04-18 NOTE — ED Provider Triage Note (Signed)
Emergency Medicine Provider Triage Evaluation Note  EUSTACE HUR , a 67 y.o. male  was evaluated in triage.  Pt complains of midsternal chest pain radiating to the left chest and down the left arm that started 3 to 4 days ago.  States at onset it was intermittent but became constant and more severe this morning prompting his ED visit.  It is associated with shortness of breath.  No recent cough, congestion, fever, vomiting, diarrhea, or other illness.  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  States that pain feels somewhat similar to that episode.  Review of Systems  Positive: See HPI Negative: See HPI  Physical Exam  BP 126/89 (BP Location: Right Arm)   Pulse 76   Temp 98.6 F (37 C) (Oral)   Resp 18   SpO2 96%  Gen:   Awake, no distress   Resp:  Normal effort lungs clear to auscultation MSK:   Moves extremities without difficulty no lower extremity edema Other:  Regular rate and rhythm, abdomen soft and nontender, patient neurologically intact  Medical Decision Making  Medically screening exam initiated at 5:05 PM.  Appropriate orders placed.  Gwynneth Albright was informed that the remainder of the evaluation will be completed by another provider, this initial triage assessment does not replace that evaluation, and the importance of remaining in the ED until their evaluation is complete.     Tonette Lederer, PA-C 04/18/22 1706

## 2022-04-18 NOTE — ED Provider Notes (Signed)
[place] EMERGENCY DEPARTMENT AT [facility] Provider Note   CSN: [id number] Arrival date & time: 04/18/22  1634     History  Chief Complaint  Patient presents with   Chest Pain    BROOKE PAYES is a 67 y.o. male with history of CAD s/p stent in 2010 who presents the emergency department complaining of chest pain.  Patient states he started having midsternal chest pain about 3 to 4 days ago.  It radiates into the left side of his chest and down his left arm.  Initially it was intermittent, but is becoming more constant.  Had a more severe episode this morning.  Associated shortness of breath.  States his symptoms today are somewhat similar to his prior MI.  Denies any recent illness, cough, congestion, fever, vomiting, or diarrhea.   Chest Pain Associated symptoms: shortness of breath        Home Medications Prior to Admission medications   Medication Sig Start Date End Date Taking? Authorizing Provider  albuterol (VENTOLIN HFA) 108 (90 Base) MCG/ACT inhaler INHALE 2 PUFFS INTO THE LUNGS EVERY 6 HOURS AS NEEDED 09/01/19 09/03/20  Bulla, Dorinda Hill, PA-C  albuterol (VENTOLIN HFA) 108 (90 Base) MCG/ACT inhaler Inhale 2 puffs by mouth every 6 hours as needed 01/11/21     aspirin EC 81 MG tablet Take 81 mg by mouth daily.     [provider]  atorvastatin (LIPITOR) 80 MG tablet Take 1 tablet by mouth daily 11/11/20     atorvastatin (LIPITOR) 80 MG tablet Take 1 tablet (80 mg total) by mouth daily. 12/02/21     atorvastatin (LIPITOR) 80 MG tablet Take 1 tablet (80 mg total) by mouth daily. 04/14/22     baclofen (LIORESAL) 10 MG tablet Take 1 tablet (10 mg total) by mouth every 8 (eight) hours as needed for muscle spasm 06/03/21     carvedilol (COREG) 6.25 MG tablet Take 1 tablet by mouth 2 times a day 08/09/20     carvedilol (COREG) 6.25 MG tablet Take 1 tablet by mouth two times daily for hypertension 11/11/20     carvedilol (COREG) 6.25 MG tablet Take 1 tablet by mouth two  times daily 02/14/21     carvedilol (COREG) 6.25 MG tablet Take 1 tablet (6.25 mg total) by mouth 2 (two) times daily for hypertension. 06/03/21     carvedilol (COREG) 6.25 MG tablet Take 1 tablet (6.25 mg total) by mouth 2 (two) times daily. 03/18/22     cefdinir (OMNICEF) 300 MG capsule Take 1 capsule (300 mg total) by mouth 2 (two) times daily. 04/02/21     cefdinir (OMNICEF) 300 MG capsule Take 1 capsule (300 mg total) by mouth 2 (two) times daily. 09/25/21     clopidogrel (PLAVIX) 75 MG tablet Take 1 tablet by mouth daily. 11/11/20     clopidogrel (PLAVIX) 75 MG tablet Take 1 tablet (75 mg total) by mouth daily. 01/13/22     clopidogrel (PLAVIX) 75 MG tablet Take 1 tablet (75 mg total) by mouth daily. 04/14/22     dicyclomine (BENTYL) 10 MG capsule Take 10 mg by mouth 3 (three) times daily as needed for spasms.    [provider]  dicyclomine (BENTYL) 10 MG capsule TAKE 1 CAPSULE BY MOUTH THREE TIMES DAILY BEFORE MEALS AND A BEDTIME FOR PAIN AND CRAMPING 11/12/19 11/11/20  Chauncy Lean, PA-C  dicyclomine (BENTYL) 10 MG capsule Take 1 capsule by mouth 3 times daily before meals and at bedtime for pain  and cramping. 10/06/20     dicyclomine (BENTYL) 10 MG capsule Take 1 capsule (10 mg total) by mouth 3 (three) times daily before meals and at bedtime. 09/25/21     doxycycline (VIBRAMYCIN) 100 MG capsule Take 1 capsule (100 mg total) by mouth 2 (two) times daily. 04/14/22     EPINEPHrine 0.3 mg/0.3 mL IJ SOAJ injection Inject as needed for severe reaction, repeat in 3 minutes if not improving 06/03/21     gabapentin (NEURONTIN) 300 MG capsule Take 1 capsule (300 mg total) by mouth 3 (three) times daily. 04/07/20     gabapentin (NEURONTIN) 300 MG capsule Take 1 capsule by mouth three times daily for chest wall/neck and back pain. 06/28/21     gabapentin (NEURONTIN) 300 MG capsule Take 1 capsule (300 mg total) by mouth 3 (three) times daily for chest wall/neck & back pain 04/06/22      HYDROcodone-acetaminophen (NORCO) 10-325 MG per tablet Take 0.5-1 tablets by mouth every 6 (six) hours as needed.    [provider]  HYDROcodone-acetaminophen (NORCO) 10-325 MG tablet Take 1 (one) Tablet by mouth two times daily, max daily dose: 2 Tablets 04/07/20     HYDROcodone-acetaminophen (NORCO) 10-325 MG tablet Take 1 (one) Tablet by mouth two times daily, max daily dose: 2 Tablets 06/07/20     HYDROcodone-acetaminophen (NORCO) 10-325 MG tablet Take 1 (one) Tablet by mouth two times daily, max daily dose: 2 Tablet 07/09/20     HYDROcodone-acetaminophen (NORCO) 10-325 MG tablet Take 1 tablet by mouth two times daily (max 2 per day) 11/11/20     HYDROcodone-acetaminophen (NORCO) 10-325 MG tablet Take 1 tablet by mouth 2 times daily, max daily dose: 2 tablets 01/11/21     HYDROcodone-acetaminophen (NORCO) 10-325 MG tablet Take 1 tablet by mouth 2 (two) times daily. 04/14/22     ibuprofen (ADVIL) 800 MG tablet Take 1 tablet (800 mg total) by mouth 3 (three) times daily. 04/09/21     lidocaine in alum & mag hydroxide-simeth suspension Take 15 ml (1 tablespoonful) by mouth as directed every 3 hours as needed for severe heartburn. **Shake well before each use.** 09/09/20     lidocaine-alum & mag hydroxide-simeth Take 1-2 tablespoonfuls by mouth every 3 hours as needed for esophageal spasms/pain 04/30/20     lidocaine-alum & mag hydroxide-simeth Take 30 mls by mouth every 4 hours as needed 10/14/21     lidocaine-alum & mag hydroxide-simeth Use as directed 15-30 mLs in the mouth or throat every 3 (three) hours as needed for servere heartburn. (shake well before using) 01/10/22     losartan (COZAAR) 25 MG tablet Take 1 tablet by mouth daily. Discontinue quinapril 06/28/21     losartan (COZAAR) 25 MG tablet Take 1 tablet (25 mg total) by mouth daily (discontinue quinapril) 02/02/22     methocarbamol (ROBAXIN) 500 MG tablet Take 1 tablet (500 mg total) by mouth 3 (three) times daily. 04/09/21     montelukast  (SINGULAIR) 10 MG tablet Take 1 tablet (10 mg total) by mouth daily for chronic allergies. 04/07/20     montelukast (SINGULAIR) 10 MG tablet Take 1 tablet by mouth daily for chronic allergies 12/18/20     montelukast (SINGULAIR) 10 MG tablet Take 1 (one) Tablet daily for chronic allergies. 01/05/21     montelukast (SINGULAIR) 10 MG tablet Take 1 tablet by mouth daily for chronic allergies 09/07/21     Na Sulfate-K Sulfate-Mg Sulf (SUPREP BOWEL PREP KIT) 17.5-3.13-1.6 GM/177ML SOLN Use as directed per  prep sheet 10/28/20     nitroGLYCERIN (NITROSTAT) 0.4 MG SL tablet Place 0.4 mg under the tongue every 5 (five) minutes as needed for chest pain.    [provider]  nitroGLYCERIN (NITROSTAT) 0.4 MG SL tablet Dissolve 1 tablet under tongue every 5 minutes up to 3 doses for chest pain, if no relief, call 911. 12/02/21     ofloxacin (OCUFLOX) 0.3 % ophthalmic solution Place 1 drop into the right eye 4 (four) times daily for 1 week 12/15/21     omeprazole (PRILOSEC) 40 MG capsule Take 40 mg by mouth daily.    [provider]  prednisoLONE acetate (PRED FORTE) 1 % ophthalmic suspension Instill 1 drop into left eye 4 times a day for 1 week, then 2  times a day for 1 week, then daily for 2 weeks then STOP 11/28/21     prednisoLONE acetate (PRED FORTE) 1 % ophthalmic suspension Place 1 drop into the right eye 4 (four) times daily for 1 week, then 2 times a day for 1 week, then daily for 2 weeks then STOP 12/15/21     quinapril (ACCUPRIL) 5 MG tablet Take 1 tablet by mouth daily 08/09/20     ranitidine (ZANTAC) 150 MG tablet Take 150 mg by mouth 2 (two) times daily.    [provider]  sertraline (ZOLOFT) 50 MG tablet Take 1 tablet by mouth daily 10/12/20     sertraline (ZOLOFT) 50 MG tablet Take 1 tablet (50 mg total) by mouth daily. 11/02/21     sertraline (ZOLOFT) 50 MG tablet Take 1 tablet (50 mg total) by mouth daily. 11/04/21     spironolactone (ALDACTONE) 25 MG tablet Take 1/2 tablet by  mouth daily. 12/21/20     spironolactone (ALDACTONE) 25 MG tablet Take 0.5 tablets (12.5 mg total) by mouth daily. 03/18/22     sucralfate (CARAFATE) 1 g tablet Dissolve 1 tablet in 1 oz of water & take by mouth 3 times a day before meals 06/03/21     sucralfate (CARAFATE) 1 g tablet Take 1 tablet (1 g total) dissolved in 1 ounce of water by mouth 3 (three) times daily before meals. 03/18/22     sucralfate (CARAFATE) 1 GM/10ML suspension Take 1 g by mouth 4 (four) times daily -  with meals and at bedtime.    [provider]      Allergies    Bee venom and Ketorolac    Review of Systems   Review of Systems  Respiratory:  Positive for shortness of breath.   Cardiovascular:  Positive for chest pain.  All other systems reviewed and are negative.   Physical Exam Updated Vital Signs BP 105/79   Pulse 66   Temp 98.7 F (37.1 C) (Oral)   Resp (!) 21   Ht  (1.727 m)   Wt 72.6 kg   SpO2 96%   BMI 24.33 kg/m  Physical Exam Vitals and nursing note reviewed.  Constitutional:      Appearance: Normal appearance.  HENT:     Head: Normocephalic and atraumatic.  Eyes:     Conjunctiva/sclera: Conjunctivae normal.  Cardiovascular:     Rate and Rhythm: Normal rate and regular rhythm.  Pulmonary:     Effort: Pulmonary effort is normal. No respiratory distress.     Breath sounds: Normal breath sounds.  Abdominal:     General: There is no distension.     Palpations: Abdomen is soft.     Tenderness: There is  abdominal tenderness in the left upper quadrant. There is guarding. There is no rebound.  Skin:    General: Skin is warm and dry.  Neurological:     General: No focal deficit present.     Mental Status: He is alert.     ED Results / Procedures / Treatments   Labs (all labs ordered are listed, but only abnormal results are displayed) Labs Reviewed  BASIC METABOLIC PANEL - Abnormal; Notable for the following components:      Result Value   Glucose, Bld 118 (*)    All  other components within normal limits  CBC  D-DIMER, QUANTITATIVE  TROPONIN I (HIGH SENSITIVITY)  TROPONIN I (HIGH SENSITIVITY)    EKG EKG Interpretation  Date/Time:  Tuesday April 18 2022 16:42:38 EDT Ventricular Rate:  76 PR Interval:  182 QRS Duration: 82 QT Interval:  382 QTC Calculation: 429 R Axis:   -30 Text Interpretation: Sinus rhythm with occasional Premature ventricular complexes Left axis deviation Low voltage QRS Cannot rule out Anteroseptal infarct , age undetermined Abnormal ECG When compared with ECG of 17-Dec-2012 13:52, PREVIOUS ECG IS PRESENT No acute changes No significant change since last tracing Confirmed by Derwood Kaplan [phone]) on 04/18/2022 7:16:46 PM  Radiology CT ABDOMEN PELVIS W CONTRAST  Result Date: 04/18/2022 CLINICAL DATA:  LUQ abdominal pain EXAM: CT ABDOMEN AND PELVIS WITH CONTRAST TECHNIQUE: Multidetector CT imaging of the abdomen and pelvis was performed using the standard protocol following bolus administration of intravenous contrast. RADIATION DOSE REDUCTION: This exam was performed according to the departmental dose-optimization program which includes automated exposure control, adjustment of the mA and/or kV according to patient size and/or use of iterative reconstruction technique. CONTRAST:  75mL OMNIPAQUE IOHEXOL 350 MG/ML SOLN COMPARISON:  None Available. FINDINGS: Lower chest: No acute abnormality. Hepatobiliary: No focal liver abnormality. No gallstones, gallbladder wall thickening, or pericholecystic fluid. No biliary dilatation. Pancreas: No focal lesion. Normal pancreatic contour. No surrounding inflammatory changes. No main pancreatic ductal dilatation. Spleen: Normal in size without focal abnormality. Adrenals/Urinary Tract: No adrenal nodule bilaterally. Bilateral kidneys enhance symmetrically. No hydronephrosis. No hydroureter. The urinary bladder is unremarkable. On delayed imaging, there is no urothelial wall thickening and there are no  filling defects in the opacified portions of the bilateral collecting systems or ureters. Stomach/Bowel: Stomach is within normal limits. No evidence of bowel wall thickening or dilatation. Status post appendectomy. Vascular/Lymphatic: No abdominal aorta or iliac aneurysm. At least moderate atherosclerotic plaque of the aorta and its branches. No abdominal, pelvic, or inguinal lymphadenopathy. Reproductive: Prostate is unremarkable. Other: No intraperitoneal free fluid. No intraperitoneal free gas. No organized fluid collection. Musculoskeletal: No abdominal wall hernia or abnormality. No suspicious lytic or blastic osseous lesions. No acute displaced fracture. Multilevel degenerative changes of the spine. IMPRESSION: 1. No acute intra-abdominal or intrapelvic abnormality. 2.  Aortic Atherosclerosis (ICD10-I70.0). Electronically Signed   By: Tish Frederickson M.D.   On: 04/18/2022 21:31   DG Chest 2 View  Result Date: 04/18/2022 CLINICAL DATA:  Chest pain EXAM: CHEST - 2 VIEW COMPARISON:  X-ray 11/20/2017 FINDINGS: The heart size and mediastinal contours are within normal limits. Both lungs are clear. No consolidation, pneumothorax or effusion. No edema. The visualized skeletal structures are unremarkable. IMPRESSION: No acute cardiopulmonary disease Electronically Signed   By: Karen Kays M.D.   On: 04/18/2022 18:02    Procedures Procedures    Medications Ordered in ED Medications  iohexol (OMNIPAQUE) 350 MG/ML injection 75 mL (75 mLs Intravenous Contrast Given  04/18/22 2115)    ED Course/ Medical Decision Making/ A&P             HEART Score: 3                Medical Decision Making Amount and/or Complexity of Data Reviewed Labs: ordered. Radiology: ordered.  Risk Prescription drug management.   This patient is a 67 y.o. male  who presents to the ED for concern of chest pain.   Differential diagnoses prior to evaluation: The emergent differential diagnosis includes, but is not limited  to,  ACS, pericarditis, myocarditis, aortic dissection, PE, pneumothorax, esophageal spasm or rupture, chronic angina, pneumonia, bronchitis, GERD, reflux/PUD, biliary disease, pancreatitis, costochondritis, anxiety . This is not an exhaustive differential.   Past Medical History / Co-morbidities: CAD s/p stent in 2010  Physical Exam: Physical exam performed. The pertinent findings include: Vital signs, no acute distress.  Heart regular rate and rhythm, lung sounds clear.  Normal respiratory effort.  Left upper quadrant tenderness to palpation with mild guarding.  Lab Tests/Imaging studies: I personally interpreted labs/imaging and the pertinent results include: CBC and BMP unremarkable.  Initial troponin 4, delta troponin 5.  D-dimer negative at 0.41.  CT abdomen pelvis with no acute abnormalities. I agree with the radiologist interpretation.  Cardiac monitoring: EKG obtained and interpreted by my attending physician which shows: Sinus rhythm with occasional PVC, no acute change compared to prior  Disposition: After consideration of the diagnostic results and the patients response to treatment, I feel that emergency department workup does not suggest an emergent condition requiring admission or immediate intervention beyond what has been performed at this time. Patient is to be discharged with recommendation to follow up with PCP in regards to today's hospital visit. Chest pain is not likely of cardiac or pulmonary etiology d/t presentation, d-dimer for PE negative, VSS, no tracheal deviation, no JVD or new murmur, RRR, breath sounds equal bilaterally, EKG without acute abnormalities, negative troponin, and negative CXR. Heart score of 3. Pt has been advised to return to the ED if CP becomes exertional, associated with diaphoresis or nausea, radiates to left jaw/arm, worsens or becomes concerning in any way. Pt appears reliable for follow up and is agreeable to discharge.   I discussed this case  with my attending physician Dr. Rhunette Croft who cosigned this note including patient's presenting symptoms, physical exam, and planned diagnostics and interventions. Attending physician stated agreement with plan or made changes to plan which were implemented.    Final Clinical Impression(s) / ED Diagnoses Final diagnoses:  Nonspecific chest pain  LUQ pain    Rx / DC Orders ED Discharge Orders     None      Portions of this report may have been transcribed using voice recognition software. Every effort was made to ensure accuracy; however, inadvertent computerized transcription errors may be present.    Jeanella Flattery 04/18/22 2138    Derwood Kaplan, MD 04/18/22 2356

## 2022-04-18 NOTE — ED Triage Notes (Signed)
Pt reports central chest pain with radiation to L side with associated sob x 3-4 days. Has been intermittent but is increasingly frequent. Hx MI with stent placement in 2010.

## 2022-04-18 NOTE — ED Notes (Signed)
Pt transport to Ct

## 2022-04-18 NOTE — Discharge Instructions (Signed)
You are seen in the emergency department today for chest and abdominal pain.  As we discussed your workup was reassuring today.  We did not find an emergent etiology for your symptoms.  We looked at the ends of heart attack, blood clot in your lungs, or any blockages/infections in your abdomen, and we found none.  I recommend following up with your primary doctor regarding your symptoms today. Continue to monitor how you're doing and return to the ER for new or worsening symptoms.

## 2022-04-28 ENCOUNTER — Other Ambulatory Visit (HOSPITAL_COMMUNITY): Payer: Self-pay

## 2022-04-28 MED ORDER — MONTELUKAST SODIUM 10 MG PO TABS
10.0000 mg | ORAL_TABLET | Freq: Every day | ORAL | 5 refills | Status: DC
  Filled 2022-04-28 – 2022-07-05 (×2): qty 30, 30d supply, fill #0
  Filled 2022-08-07: qty 30, 30d supply, fill #1
  Filled 2023-01-05: qty 30, 30d supply, fill #2
  Filled 2023-04-05: qty 90, 90d supply, fill #3

## 2022-04-28 MED ORDER — MONTELUKAST SODIUM 10 MG PO TABS
10.0000 mg | ORAL_TABLET | Freq: Every day | ORAL | 5 refills | Status: DC
  Filled 2022-04-28: qty 30, 30d supply, fill #0

## 2022-04-29 ENCOUNTER — Other Ambulatory Visit (HOSPITAL_COMMUNITY): Payer: Self-pay

## 2022-05-06 ENCOUNTER — Other Ambulatory Visit (HOSPITAL_COMMUNITY): Payer: Self-pay

## 2022-05-08 ENCOUNTER — Other Ambulatory Visit (HOSPITAL_COMMUNITY): Payer: Self-pay

## 2022-05-15 ENCOUNTER — Other Ambulatory Visit (HOSPITAL_COMMUNITY): Payer: Self-pay

## 2022-05-16 ENCOUNTER — Other Ambulatory Visit (HOSPITAL_COMMUNITY): Payer: Self-pay

## 2022-05-16 MED ORDER — HYDROCODONE-ACETAMINOPHEN 10-325 MG PO TABS
1.0000 | ORAL_TABLET | Freq: Two times a day (BID) | ORAL | 0 refills | Status: DC
  Filled 2022-05-16: qty 60, 30d supply, fill #0

## 2022-06-03 ENCOUNTER — Other Ambulatory Visit (HOSPITAL_COMMUNITY): Payer: Self-pay

## 2022-06-12 ENCOUNTER — Other Ambulatory Visit (HOSPITAL_COMMUNITY): Payer: Self-pay

## 2022-06-15 ENCOUNTER — Other Ambulatory Visit (HOSPITAL_COMMUNITY): Payer: Self-pay

## 2022-06-16 ENCOUNTER — Other Ambulatory Visit (HOSPITAL_COMMUNITY): Payer: Self-pay

## 2022-06-16 MED ORDER — HYDROCODONE-ACETAMINOPHEN 10-325 MG PO TABS
1.0000 | ORAL_TABLET | Freq: Two times a day (BID) | ORAL | 0 refills | Status: DC
  Filled 2022-06-16: qty 60, 30d supply, fill #0

## 2022-07-05 ENCOUNTER — Other Ambulatory Visit (HOSPITAL_COMMUNITY): Payer: Self-pay

## 2022-07-17 ENCOUNTER — Other Ambulatory Visit (HOSPITAL_COMMUNITY): Payer: Self-pay

## 2022-07-18 ENCOUNTER — Other Ambulatory Visit: Payer: Self-pay

## 2022-07-19 ENCOUNTER — Other Ambulatory Visit (HOSPITAL_COMMUNITY): Payer: Self-pay

## 2022-07-20 ENCOUNTER — Other Ambulatory Visit (HOSPITAL_COMMUNITY): Payer: Self-pay

## 2022-07-20 MED ORDER — HYDROCODONE-ACETAMINOPHEN 10-325 MG PO TABS
1.0000 | ORAL_TABLET | Freq: Two times a day (BID) | ORAL | 0 refills | Status: DC
  Filled 2022-07-20: qty 60, 30d supply, fill #0

## 2022-08-03 ENCOUNTER — Other Ambulatory Visit: Payer: Self-pay

## 2022-08-03 ENCOUNTER — Emergency Department (HOSPITAL_COMMUNITY): Payer: Medicare HMO

## 2022-08-03 ENCOUNTER — Encounter (HOSPITAL_COMMUNITY): Payer: Self-pay

## 2022-08-03 ENCOUNTER — Emergency Department (HOSPITAL_COMMUNITY)
Admission: EM | Admit: 2022-08-03 | Discharge: 2022-08-03 | Disposition: A | Discharge status: Home / Self Care | Payer: Medicare HMO | Source: Home / Self Care | Attending: Emergency Medicine | Admitting: Emergency Medicine

## 2022-08-03 DIAGNOSIS — Z7982 Long term (current) use of aspirin: Secondary | ICD-10-CM | POA: Insufficient documentation

## 2022-08-03 DIAGNOSIS — R0789 Other chest pain: Secondary | ICD-10-CM | POA: Insufficient documentation

## 2022-08-03 DIAGNOSIS — I2510 Atherosclerotic heart disease of native coronary artery without angina pectoris: Secondary | ICD-10-CM | POA: Diagnosis not present

## 2022-08-03 DIAGNOSIS — R079 Chest pain, unspecified: Secondary | ICD-10-CM | POA: Diagnosis present

## 2022-08-03 LAB — BASIC METABOLIC PANEL
Anion gap: 14 (ref 5–15)
BUN: 12 mg/dL (ref 8–23)
CO2: 21 mmol/L — ABNORMAL LOW (ref 22–32)
Calcium: 9.4 mg/dL (ref 8.9–10.3)
Chloride: 104 mmol/L (ref 98–111)
Creatinine, Ser: 1.13 mg/dL (ref 0.61–1.24)
GFR, Estimated: >60 mL/min (ref >60)
Glucose, Bld: 115 mg/dL — ABNORMAL HIGH (ref 70–99)
Potassium: 3.7 mmol/L (ref 3.5–5.1)
Sodium: 139 mmol/L (ref 135–145)

## 2022-08-03 LAB — CBC
HCT: 41 % (ref 39.0–52.0)
Hemoglobin: 13.9 g/dL (ref 13.0–17.0)
MCH: 30.7 pg (ref 26.0–34.0)
MCHC: 33.9 g/dL (ref 30.0–36.0)
MCV: 90.5 fL (ref 80.0–100.0)
Platelets: 298 10*3/uL (ref 150–400)
RBC: 4.53 MIL/uL (ref 4.22–5.81)
RDW: 14.1 % (ref 11.5–15.5)
WBC: 8.7 10*3/uL (ref 4.0–10.5)
nRBC: 0 % (ref 0.0–0.2)

## 2022-08-03 LAB — TROPONIN I (HIGH SENSITIVITY)
Troponin I (High Sensitivity): 5 ng/L (ref <18)
Troponin I (High Sensitivity): 6 ng/L (ref <18)

## 2022-08-03 MED ORDER — ACETAMINOPHEN 500 MG PO TABS
1000.0000 mg | ORAL_TABLET | Freq: Once | ORAL | Status: AC
  Administered 2022-08-03: 1000 mg via ORAL
  Filled 2022-08-03: qty 2

## 2022-08-03 MED ORDER — FAMOTIDINE 20 MG PO TABS
20.0000 mg | ORAL_TABLET | Freq: Once | ORAL | Status: AC
  Administered 2022-08-03: 20 mg via ORAL
  Filled 2022-08-03: qty 1

## 2022-08-03 MED ORDER — ALUM & MAG HYDROXIDE-SIMETH 200-200-20 MG/5ML PO SUSP
30.0000 mL | Freq: Once | ORAL | Status: AC
  Administered 2022-08-03: 30 mL via ORAL
  Filled 2022-08-03: qty 30

## 2022-08-03 NOTE — ED Provider Notes (Signed)
[place] EMERGENCY DEPARTMENT AT [facility] Provider Note   CSN: [id number] Arrival date & time: 08/03/22  1617     History  Chief Complaint  Patient presents with   Chest Pain    Anthony Newman is a 67 y.o. male w/ hx of CAD s/p stent in 2010,   Reports ~1wk hx of intermittent central chest pain. About 1 wk ago, was playing golf and when the pain started. He thought it might have been related to a pickle he ate at that time. Described as "burning" pain in center of chest, worsens with exertion. Pain improves with rest. Pain started in central sternal area, but now migrating more to L lower ribs. Pain feels similar to heartburn. Pt has hx of GERD, which he take sucralfate for, however reports taking it inconsistently.  Today he was mowing grass, started get the pain, went inside and took a nitroglycerin. Feels like the nitroglycerin helped. Pain was particularly bad today, which prompted him to come to ED for evaluation.  Denies SOB, dizziness.    Chest Pain Associated symptoms: no shortness of breath       Home Medications Prior to Admission medications   Medication Sig Start Date End Date Taking? Authorizing Provider  albuterol (VENTOLIN HFA) 108 (90 Base) MCG/ACT inhaler INHALE 2 PUFFS INTO THE LUNGS EVERY 6 HOURS AS NEEDED 09/01/19 09/03/20  Bulla, Dorinda Hill, PA-C  albuterol (VENTOLIN HFA) 108 (90 Base) MCG/ACT inhaler Inhale 2 puffs by mouth every 6 hours as needed 01/11/21     aspirin EC 81 MG tablet Take 81 mg by mouth daily.     [provider]  atorvastatin (LIPITOR) 80 MG tablet Take 1 tablet by mouth daily 11/11/20     atorvastatin (LIPITOR) 80 MG tablet Take 1 tablet (80 mg total) by mouth daily. 12/02/21     atorvastatin (LIPITOR) 80 MG tablet Take 1 tablet (80 mg total) by mouth daily. 04/14/22     baclofen (LIORESAL) 10 MG tablet Take 1 tablet (10 mg total) by mouth every 8 (eight) hours as needed for muscle spasm 06/03/21     carvedilol (COREG) 6.25 MG  tablet Take 1 tablet by mouth 2 times a day 08/09/20     carvedilol (COREG) 6.25 MG tablet Take 1 tablet by mouth two times daily for hypertension 11/11/20     carvedilol (COREG) 6.25 MG tablet Take 1 tablet by mouth two times daily 02/14/21     carvedilol (COREG) 6.25 MG tablet Take 1 tablet (6.25 mg total) by mouth 2 (two) times daily for hypertension. 06/03/21     carvedilol (COREG) 6.25 MG tablet Take 1 tablet (6.25 mg total) by mouth 2 (two) times daily. 03/18/22     cefdinir (OMNICEF) 300 MG capsule Take 1 capsule (300 mg total) by mouth 2 (two) times daily. 04/02/21     cefdinir (OMNICEF) 300 MG capsule Take 1 capsule (300 mg total) by mouth 2 (two) times daily. 09/25/21     clopidogrel (PLAVIX) 75 MG tablet Take 1 tablet by mouth daily. 11/11/20     clopidogrel (PLAVIX) 75 MG tablet Take 1 tablet (75 mg total) by mouth daily. 01/13/22     clopidogrel (PLAVIX) 75 MG tablet Take 1 tablet (75 mg total) by mouth daily. 04/14/22     dicyclomine (BENTYL) 10 MG capsule Take 10 mg by mouth 3 (three) times daily as needed for spasms.    [provider]  dicyclomine (BENTYL) 10 MG capsule TAKE 1 CAPSULE  BY MOUTH THREE TIMES DAILY BEFORE MEALS AND A BEDTIME FOR PAIN AND CRAMPING 11/12/19 11/11/20  Chauncy Lean, PA-C  dicyclomine (BENTYL) 10 MG capsule Take 1 capsule by mouth 3 times daily before meals and at bedtime for pain and cramping. 10/06/20     dicyclomine (BENTYL) 10 MG capsule Take 1 capsule (10 mg total) by mouth 3 (three) times daily before meals and at bedtime. 09/25/21     doxycycline (VIBRAMYCIN) 100 MG capsule Take 1 capsule (100 mg total) by mouth 2 (two) times daily. 04/14/22     EPINEPHrine 0.3 mg/0.3 mL IJ SOAJ injection Inject as needed for severe reaction, repeat in 3 minutes if not improving 06/03/21     gabapentin (NEURONTIN) 300 MG capsule Take 1 capsule (300 mg total) by mouth 3 (three) times daily. 04/07/20     gabapentin (NEURONTIN) 300 MG capsule Take 1 capsule by mouth three  times daily for chest wall/neck and back pain. 06/28/21     gabapentin (NEURONTIN) 300 MG capsule Take 1 capsule (300 mg total) by mouth 3 (three) times daily for chest wall/neck & back pain 04/06/22     HYDROcodone-acetaminophen (NORCO) 10-325 MG per tablet Take 0.5-1 tablets by mouth every 6 (six) hours as needed.    [provider]  HYDROcodone-acetaminophen (NORCO) 10-325 MG tablet Take 1 (one) Tablet by mouth two times daily, max daily dose: 2 Tablets 04/07/20     HYDROcodone-acetaminophen (NORCO) 10-325 MG tablet Take 1 (one) Tablet by mouth two times daily, max daily dose: 2 Tablets 06/07/20     HYDROcodone-acetaminophen (NORCO) 10-325 MG tablet Take 1 (one) Tablet by mouth two times daily, max daily dose: 2 Tablet 07/09/20     HYDROcodone-acetaminophen (NORCO) 10-325 MG tablet Take 1 tablet by mouth two times daily (max 2 per day) 11/11/20     HYDROcodone-acetaminophen (NORCO) 10-325 MG tablet Take 1 tablet by mouth 2 times daily, max daily dose: 2 tablets 01/11/21     HYDROcodone-acetaminophen (NORCO) 10-325 MG tablet Take 1 tablet by mouth 2 times daily. 07/20/22     ibuprofen (ADVIL) 800 MG tablet Take 1 tablet (800 mg total) by mouth 3 (three) times daily. 04/09/21     lidocaine in alum & mag hydroxide-simeth suspension Take 15 ml (1 tablespoonful) by mouth as directed every 3 hours as needed for severe heartburn. **Shake well before each use.** 09/09/20     lidocaine-alum & mag hydroxide-simeth Take 1-2 tablespoonfuls by mouth every 3 hours as needed for esophageal spasms/pain 04/30/20     lidocaine-alum & mag hydroxide-simeth Take 30 mls by mouth every 4 hours as needed 10/14/21     lidocaine-alum & mag hydroxide-simeth Use as directed 15-30 mLs in the mouth or throat every 3 (three) hours as needed for servere heartburn. (shake well before using) 01/10/22     losartan (COZAAR) 25 MG tablet Take 1 tablet by mouth daily. Discontinue quinapril 06/28/21     losartan (COZAAR) 25 MG tablet Take 1  tablet (25 mg total) by mouth daily (discontinue quinapril) 02/02/22     methocarbamol (ROBAXIN) 500 MG tablet Take 1 tablet (500 mg total) by mouth 3 (three) times daily. 04/09/21     montelukast (SINGULAIR) 10 MG tablet Take 1 tablet (10 mg total) by mouth daily for chronic allergies. 04/07/20     montelukast (SINGULAIR) 10 MG tablet Take 1 tablet by mouth daily for chronic allergies 12/18/20     montelukast (SINGULAIR) 10 MG tablet Take 1 tablet by mouth daily for  chronic allergies 09/07/21     montelukast (SINGULAIR) 10 MG tablet Take 1 tablet (10 mg total) by mouth daily for chronic allergies 04/28/22     montelukast (SINGULAIR) 10 MG tablet Take 1 tablet (10 mg total) by mouth daily for chronic allergies. 04/28/22     Na Sulfate-K Sulfate-Mg Sulf (SUPREP BOWEL PREP KIT) 17.5-3.13-1.6 GM/177ML SOLN Use as directed per prep sheet 10/28/20     nitroGLYCERIN (NITROSTAT) 0.4 MG SL tablet Place 0.4 mg under the tongue every 5 (five) minutes as needed for chest pain.    [provider]  nitroGLYCERIN (NITROSTAT) 0.4 MG SL tablet Dissolve 1 tablet under tongue every 5 minutes up to 3 doses for chest pain, if no relief, call 911. 12/02/21     ofloxacin (OCUFLOX) 0.3 % ophthalmic solution Place 1 drop into the right eye 4 (four) times daily for 1 week 12/15/21     omeprazole (PRILOSEC) 40 MG capsule Take 40 mg by mouth daily.    [provider]  prednisoLONE acetate (PRED FORTE) 1 % ophthalmic suspension Instill 1 drop into left eye 4 times a day for 1 week, then 2  times a day for 1 week, then daily for 2 weeks then STOP 11/28/21     prednisoLONE acetate (PRED FORTE) 1 % ophthalmic suspension Place 1 drop into the right eye 4 (four) times daily for 1 week, then 2 times a day for 1 week, then daily for 2 weeks then STOP 12/15/21     quinapril (ACCUPRIL) 5 MG tablet Take 1 tablet by mouth daily 08/09/20     ranitidine (ZANTAC) 150 MG tablet Take 150 mg by mouth 2 (two) times daily.    [provider]  sertraline (ZOLOFT) 50 MG tablet Take 1 tablet by mouth daily 10/12/20     sertraline (ZOLOFT) 50 MG tablet Take 1 tablet (50 mg total) by mouth daily. 11/02/21     sertraline (ZOLOFT) 50 MG tablet Take 1 tablet (50 mg total) by mouth daily. 11/04/21     spironolactone (ALDACTONE) 25 MG tablet Take 1/2 tablet by mouth daily. 12/21/20     spironolactone (ALDACTONE) 25 MG tablet Take 1/2 tablet (12.5 mg total) by mouth daily. 03/18/22     sucralfate (CARAFATE) 1 g tablet Dissolve 1 tablet in 1 oz of water & take by mouth 3 times a day before meals 06/03/21     sucralfate (CARAFATE) 1 g tablet Take 1 tablet (1 g total) dissolved in 1 ounce of water by mouth 3 (three) times daily before meals. 03/18/22     sucralfate (CARAFATE) 1 GM/10ML suspension Take 1 g by mouth 4 (four) times daily -  with meals and at bedtime.    [provider]      Allergies    Bee venom and Ketorolac    Review of Systems   Review of Systems  Respiratory:  Negative for shortness of breath.   Cardiovascular:  Positive for chest pain.   Physical Exam Updated Vital Signs BP (!) 130/107   Pulse 66   Temp 98.1 F (36.7 C) (Oral)   Resp 18   Ht 5\' 8"  (1.727 m)   Wt 71.2 kg   SpO2 99%   BMI 23.87 kg/m  Physical Exam Gen: Alert, pleasant man laying comfortably in bed and speaking in full sentences. NAD HEENT: NCAT. MMM. CV: RRR, no murmurs. Radial pulses 2+. Resp: CTAB, no wheezing or crackles. Normal WOB on RA Abm: Soft, nontender, nondistended. Normal  BS. Msk: Tender to palpation over L lower anterior ribs.  Skin: Warm, well perfused.   ED Results / Procedures / Treatments   Labs (all labs ordered are listed, but only abnormal results are displayed) Labs Reviewed  BASIC METABOLIC PANEL - Abnormal; Notable for the following components:      Result Value   CO2 21 (*)    Glucose, Bld 115 (*)    All other components within normal limits  CBC  TROPONIN I (HIGH SENSITIVITY)  TROPONIN I  (HIGH SENSITIVITY)    EKG EKG Interpretation Date/Time:  Thursday August 03 2022 16:14:50 EDT Ventricular Rate:  82 PR Interval:  144 QRS Duration:  78 QT Interval:  374 QTC Calculation: 436 R Axis:   -42  Text Interpretation: Sinus rhythm with Fusion complexes Left axis deviation Anterior infarct , age undetermined Abnormal ECG When compared with ECG of 18-Apr-2022 16:42, PREVIOUS ECG IS PRESENT Confirmed by Glyn Ade [phone]) on 08/03/2022 9:05:44 PM  Radiology DG Chest 2 View  Result Date: 08/03/2022 CLINICAL DATA:  Chest pain EXAM: CHEST - 2 VIEW COMPARISON:  Chest x-ray 04/18/2022 FINDINGS: The heart size and mediastinal contours are within normal limits. Both lungs are clear. Cervical spinal fusion plate is present. No acute fractures are seen. IMPRESSION: No active cardiopulmonary disease. Electronically Signed   By: Darliss Cheney M.D.   On: 08/03/2022 17:14    Procedures Procedures   Medications Ordered in ED Medications  alum & mag hydroxide-simeth (MAALOX/MYLANTA) 200-200-20 MG/5ML suspension 30 mL (30 mLs Oral Given 08/03/22 2219)  famotidine (PEPCID) tablet 20 mg (20 mg Oral Given 08/03/22 2218)  acetaminophen (TYLENOL) tablet 1,000 mg (1,000 mg Oral Given 08/03/22 2218)    ED Course/ Medical Decision Making/ A&P Clinical Course as of 08/03/22 2301  Thu Aug 03, 2022  2116 DG Chest 2 View [JZ]  2145 Stable Burning chest pain x1 week. Hx of CAD s/p PCI Hx of GERD. Off meds now.   [CC]    Clinical Course User Index [CC] Glyn Ade, MD [JZ] Lincoln Brigham, MD            HEART Score: 4                     Medical Decision Making:   SUHEB BERMUDES is a 67 y.o. male who presented to the ED today with chest pain detailed above.    Patient placed on continuous vitals and telemetry monitoring while in ED which was reviewed periodically.  Complete initial physical exam performed, notably the patient  was tender to palpation over L lower ribs.    Reviewed and  confirmed nursing documentation for past medical history, family history, social history.    Initial Assessment:   With the patient's presentation of chest pain, most likely diagnosis is costochondritis vs GERD. Other diagnoses were considered including (but not limited to) ACS, PE. These are considered less likely due to history of present illness and physical exam findings.   This is most consistent with an acute complicated illness  Initial Plan:  Tylenol 1000mg  for pain control. Maalox and Protonix PO for GERD. Screening labs including CBC and Metabolic panel to evaluate for infectious or metabolic etiology of disease.  CXR to evaluate for structural/infectious intrathoracic pathology.  EKG to evaluate for cardiac pathology Objective evaluation as below reviewed   Initial Study Results:   Laboratory  All laboratory results reviewed without evidence of clinically relevant pathology.    EKG EKG was reviewed  independently. Rate, rhythm, axis, intervals all examined and without medically relevant abnormality. ST segments without concerns for elevations.    Radiology:  All images reviewed independently. Agree with radiology report at this time.   DG Chest 2 View  Result Date: 08/03/2022 CLINICAL DATA:  Chest pain EXAM: CHEST - 2 VIEW COMPARISON:  Chest x-ray 04/18/2022 FINDINGS: The heart size and mediastinal contours are within normal limits. Both lungs are clear. Cervical spinal fusion plate is present. No acute fractures are seen. IMPRESSION: No active cardiopulmonary disease. Electronically Signed   By: Darliss Cheney M.D.   On: 08/03/2022 17:14     Reassessment and Plan:   - Pain improved s/p tylenol, Maalox, and protonix, which further supports GERD as etiology of pain. Costochondritis may also be contributing given pain started during recent golfing, location of pain, and reproducibility w/ palpation.  - Given negative trop x2, EKG w/o signs of ischemia, VSS, reproducibility of pain  with palpation, pain is less likely ACS. PE is also less likely given stable vitals on RA.  - Discussed options to discharge w/ return precautions and Cardiology f/u versus admission for observation overnight. Pt ultimately decided on discharging tonight.  - Also encouraged to f/u w/ PCP  Final Clinical Impression(s) / ED Diagnoses Final diagnoses:  Atypical chest pain    Rx / DC Orders ED Discharge Orders     None         Lincoln Brigham, MD 08/03/22 2440    Glyn Ade, MD 08/03/22 2302

## 2022-08-03 NOTE — Discharge Instructions (Signed)
Your pain improved after giving you Tylenol and GI cocktail to treat GERD. Follow-up with your PCP in the next week.

## 2022-08-03 NOTE — ED Triage Notes (Signed)
Pt arrives via POV. Pt reports intermittent, central chest pain for the past week. States pain occurs with exertion. Denies any associated symptoms. Pt AxOx4.

## 2022-08-07 ENCOUNTER — Other Ambulatory Visit (HOSPITAL_COMMUNITY): Payer: Self-pay

## 2022-08-07 MED ORDER — AZITHROMYCIN 250 MG PO TABS
ORAL_TABLET | ORAL | 0 refills | Status: AC
  Filled 2022-08-07: qty 6, 5d supply, fill #0

## 2022-08-09 ENCOUNTER — Other Ambulatory Visit (HOSPITAL_COMMUNITY): Payer: Self-pay

## 2022-08-09 MED ORDER — LOSARTAN POTASSIUM 25 MG PO TABS
25.0000 mg | ORAL_TABLET | Freq: Every day | ORAL | 1 refills | Status: DC
  Filled 2022-08-09: qty 90, 90d supply, fill #0
  Filled 2022-11-14: qty 90, 90d supply, fill #1

## 2022-08-11 ENCOUNTER — Other Ambulatory Visit (HOSPITAL_COMMUNITY): Payer: Self-pay

## 2022-08-11 MED ORDER — HYDROCODONE BIT-HOMATROP MBR 5-1.5 MG/5ML PO SOLN
ORAL | 0 refills | Status: DC
  Filled 2022-08-11: qty 200, 6d supply, fill #0

## 2022-08-15 ENCOUNTER — Other Ambulatory Visit (HOSPITAL_COMMUNITY): Payer: Self-pay

## 2022-08-15 MED ORDER — DOXYCYCLINE HYCLATE 50 MG PO CAPS
50.0000 mg | ORAL_CAPSULE | Freq: Two times a day (BID) | ORAL | 0 refills | Status: DC
  Filled 2022-08-15: qty 14, 7d supply, fill #0

## 2022-08-15 MED ORDER — HYDROCODONE-ACETAMINOPHEN 10-325 MG PO TABS
1.0000 | ORAL_TABLET | Freq: Two times a day (BID) | ORAL | 0 refills | Status: DC
  Filled 2022-08-15: qty 60, 30d supply, fill #0

## 2022-08-15 MED ORDER — GABAPENTIN 300 MG PO CAPS
300.0000 mg | ORAL_CAPSULE | Freq: Three times a day (TID) | ORAL | 2 refills | Status: DC
  Filled 2022-08-15: qty 90, 30d supply, fill #0
  Filled 2022-09-12: qty 90, 30d supply, fill #1
  Filled 2022-11-01: qty 90, 30d supply, fill #2

## 2022-08-26 ENCOUNTER — Other Ambulatory Visit (HOSPITAL_COMMUNITY): Payer: Self-pay

## 2022-08-29 ENCOUNTER — Other Ambulatory Visit (HOSPITAL_COMMUNITY): Payer: Self-pay

## 2022-08-29 MED ORDER — FAMOTIDINE 20 MG PO TABS
20.0000 mg | ORAL_TABLET | Freq: Two times a day (BID) | ORAL | 1 refills | Status: DC
  Filled 2022-08-29 (×2): qty 60, 30d supply, fill #0
  Filled 2022-09-30: qty 60, 30d supply, fill #1

## 2022-09-05 ENCOUNTER — Other Ambulatory Visit (HOSPITAL_COMMUNITY): Payer: Self-pay

## 2022-09-05 MED ORDER — MONTELUKAST SODIUM 10 MG PO TABS
10.0000 mg | ORAL_TABLET | Freq: Every day | ORAL | 5 refills | Status: DC
  Filled 2022-09-05: qty 30, 30d supply, fill #0
  Filled 2022-09-30: qty 30, 30d supply, fill #1
  Filled 2022-11-01: qty 30, 30d supply, fill #2
  Filled 2022-11-28: qty 30, 30d supply, fill #3
  Filled 2023-02-09: qty 30, 30d supply, fill #4
  Filled 2023-03-08: qty 30, 30d supply, fill #5

## 2022-09-05 MED ORDER — ALBUTEROL SULFATE HFA 108 (90 BASE) MCG/ACT IN AERS
2.0000 | INHALATION_SPRAY | Freq: Four times a day (QID) | RESPIRATORY_TRACT | 6 refills | Status: DC | PRN
  Filled 2022-09-05: qty 6.7, 25d supply, fill #0
  Filled 2022-11-06: qty 6.7, 25d supply, fill #1
  Filled 2023-01-05: qty 6.7, 25d supply, fill #2
  Filled 2023-06-05: qty 6.7, 25d supply, fill #3

## 2022-09-07 ENCOUNTER — Other Ambulatory Visit (HOSPITAL_COMMUNITY): Payer: Self-pay

## 2022-09-12 ENCOUNTER — Other Ambulatory Visit (HOSPITAL_COMMUNITY): Payer: Self-pay

## 2022-09-15 ENCOUNTER — Other Ambulatory Visit (HOSPITAL_COMMUNITY): Payer: Self-pay

## 2022-09-15 MED ORDER — HYDROCODONE-ACETAMINOPHEN 10-325 MG PO TABS
1.0000 | ORAL_TABLET | Freq: Two times a day (BID) | ORAL | 0 refills | Status: DC
  Filled 2022-09-15: qty 60, 30d supply, fill #0

## 2022-09-30 ENCOUNTER — Other Ambulatory Visit (HOSPITAL_COMMUNITY): Payer: Self-pay

## 2022-10-06 ENCOUNTER — Other Ambulatory Visit (HOSPITAL_COMMUNITY): Payer: Self-pay

## 2022-10-07 ENCOUNTER — Other Ambulatory Visit (HOSPITAL_COMMUNITY): Payer: Self-pay

## 2022-10-13 ENCOUNTER — Other Ambulatory Visit (HOSPITAL_COMMUNITY): Payer: Self-pay

## 2022-10-13 MED ORDER — HYDROCODONE-ACETAMINOPHEN 10-325 MG PO TABS
1.0000 | ORAL_TABLET | Freq: Two times a day (BID) | ORAL | 0 refills | Status: DC
  Filled 2022-10-13: qty 60, 30d supply, fill #0

## 2022-10-13 MED ORDER — BACLOFEN 10 MG PO TABS
10.0000 mg | ORAL_TABLET | Freq: Three times a day (TID) | ORAL | 3 refills | Status: DC | PRN
  Filled 2022-10-13: qty 60, 20d supply, fill #0

## 2022-10-14 ENCOUNTER — Other Ambulatory Visit (HOSPITAL_COMMUNITY): Payer: Self-pay

## 2022-11-01 ENCOUNTER — Other Ambulatory Visit (HOSPITAL_COMMUNITY): Payer: Self-pay

## 2022-11-01 ENCOUNTER — Other Ambulatory Visit: Payer: Self-pay

## 2022-11-01 MED ORDER — FAMOTIDINE 20 MG PO TABS
20.0000 mg | ORAL_TABLET | Freq: Two times a day (BID) | ORAL | 1 refills | Status: DC
  Filled 2022-11-01: qty 60, 30d supply, fill #0
  Filled 2022-11-28: qty 60, 30d supply, fill #1

## 2022-11-02 ENCOUNTER — Other Ambulatory Visit (HOSPITAL_COMMUNITY): Payer: Self-pay

## 2022-11-06 ENCOUNTER — Other Ambulatory Visit (HOSPITAL_COMMUNITY): Payer: Self-pay

## 2022-11-14 ENCOUNTER — Other Ambulatory Visit (HOSPITAL_COMMUNITY): Payer: Self-pay

## 2022-11-14 MED ORDER — HYDROCODONE-ACETAMINOPHEN 10-325 MG PO TABS
1.0000 | ORAL_TABLET | Freq: Two times a day (BID) | ORAL | 0 refills | Status: DC
  Filled 2022-11-14: qty 60, 30d supply, fill #0

## 2022-11-20 ENCOUNTER — Other Ambulatory Visit (HOSPITAL_COMMUNITY): Payer: Self-pay

## 2022-11-21 ENCOUNTER — Other Ambulatory Visit (HOSPITAL_COMMUNITY): Payer: Self-pay

## 2022-11-28 ENCOUNTER — Other Ambulatory Visit (HOSPITAL_COMMUNITY): Payer: Self-pay

## 2022-11-28 ENCOUNTER — Other Ambulatory Visit: Payer: Self-pay

## 2022-11-28 MED ORDER — SERTRALINE HCL 50 MG PO TABS
50.0000 mg | ORAL_TABLET | Freq: Every day | ORAL | 1 refills | Status: DC
  Filled 2022-11-28: qty 90, 90d supply, fill #0
  Filled 2023-03-08: qty 90, 90d supply, fill #1

## 2022-11-29 ENCOUNTER — Other Ambulatory Visit (HOSPITAL_COMMUNITY): Payer: Self-pay

## 2022-12-02 ENCOUNTER — Other Ambulatory Visit: Payer: Self-pay

## 2022-12-02 ENCOUNTER — Other Ambulatory Visit (HOSPITAL_COMMUNITY): Payer: Self-pay

## 2022-12-13 ENCOUNTER — Other Ambulatory Visit (HOSPITAL_COMMUNITY): Payer: Self-pay

## 2022-12-14 ENCOUNTER — Other Ambulatory Visit (HOSPITAL_COMMUNITY): Payer: Self-pay

## 2022-12-14 MED ORDER — HYDROCODONE-ACETAMINOPHEN 10-325 MG PO TABS
1.0000 | ORAL_TABLET | Freq: Two times a day (BID) | ORAL | 0 refills | Status: DC
  Filled 2022-12-14: qty 60, 30d supply, fill #0

## 2022-12-14 MED ORDER — SPIRONOLACTONE 25 MG PO TABS
12.5000 mg | ORAL_TABLET | Freq: Every day | ORAL | 1 refills | Status: DC
  Filled 2022-12-14 – 2022-12-15 (×2): qty 45, 90d supply, fill #0
  Filled 2023-04-05: qty 45, 90d supply, fill #1

## 2022-12-15 ENCOUNTER — Other Ambulatory Visit (HOSPITAL_COMMUNITY): Payer: Self-pay

## 2023-01-05 ENCOUNTER — Other Ambulatory Visit: Payer: Self-pay

## 2023-01-05 ENCOUNTER — Other Ambulatory Visit (HOSPITAL_COMMUNITY): Payer: Self-pay

## 2023-01-05 MED ORDER — FAMOTIDINE 20 MG PO TABS
20.0000 mg | ORAL_TABLET | Freq: Two times a day (BID) | ORAL | 0 refills | Status: DC
  Filled 2023-01-05: qty 180, 90d supply, fill #0

## 2023-01-06 ENCOUNTER — Other Ambulatory Visit (HOSPITAL_COMMUNITY): Payer: Self-pay

## 2023-01-08 ENCOUNTER — Other Ambulatory Visit (HOSPITAL_COMMUNITY): Payer: Self-pay

## 2023-01-08 MED ORDER — DICYCLOMINE HCL 10 MG PO CAPS
10.0000 mg | ORAL_CAPSULE | Freq: Two times a day (BID) | ORAL | 5 refills | Status: DC
  Filled 2023-01-08: qty 120, 40d supply, fill #0
  Filled 2023-03-08: qty 120, 40d supply, fill #1
  Filled 2023-04-30: qty 120, 40d supply, fill #2
  Filled 2023-07-11: qty 120, 30d supply, fill #3
  Filled 2023-11-27: qty 120, 30d supply, fill #4

## 2023-01-08 MED ORDER — CARVEDILOL 6.25 MG PO TABS
6.2500 mg | ORAL_TABLET | Freq: Two times a day (BID) | ORAL | 1 refills | Status: DC
  Filled 2023-01-08: qty 180, 90d supply, fill #0
  Filled 2023-04-05: qty 180, 90d supply, fill #1

## 2023-01-12 ENCOUNTER — Other Ambulatory Visit (HOSPITAL_COMMUNITY): Payer: Self-pay

## 2023-01-12 MED ORDER — GABAPENTIN 300 MG PO CAPS
300.0000 mg | ORAL_CAPSULE | Freq: Three times a day (TID) | ORAL | 2 refills | Status: DC | PRN
  Filled 2023-01-12: qty 90, 30d supply, fill #0
  Filled 2023-02-09: qty 90, 30d supply, fill #1
  Filled 2023-04-05: qty 90, 30d supply, fill #2

## 2023-01-12 MED ORDER — HYDROCODONE-ACETAMINOPHEN 10-325 MG PO TABS
1.0000 | ORAL_TABLET | Freq: Two times a day (BID) | ORAL | 0 refills | Status: DC
  Filled 2023-01-12: qty 60, 30d supply, fill #0

## 2023-01-12 MED ORDER — ATORVASTATIN CALCIUM 80 MG PO TABS
80.0000 mg | ORAL_TABLET | Freq: Every day | ORAL | 1 refills | Status: DC
  Filled 2023-01-12: qty 90, 90d supply, fill #0
  Filled 2023-04-30: qty 90, 90d supply, fill #1

## 2023-01-29 ENCOUNTER — Other Ambulatory Visit (HOSPITAL_COMMUNITY): Payer: Self-pay

## 2023-01-29 MED ORDER — PREDNISONE 5 MG (21) PO TBPK
5.0000 mg | ORAL_TABLET | ORAL | 0 refills | Status: DC
  Filled 2023-01-29: qty 21, 6d supply, fill #0

## 2023-01-29 MED ORDER — AZITHROMYCIN 250 MG PO TABS
ORAL_TABLET | Freq: Every day | ORAL | 0 refills | Status: DC
  Filled 2023-01-29: qty 6, 5d supply, fill #0

## 2023-02-09 ENCOUNTER — Other Ambulatory Visit (HOSPITAL_COMMUNITY): Payer: Self-pay

## 2023-02-10 ENCOUNTER — Other Ambulatory Visit (HOSPITAL_COMMUNITY): Payer: Self-pay

## 2023-02-10 MED ORDER — NYSTATIN 100000 UNIT/ML MT SUSP
5.0000 mL | Freq: Four times a day (QID) | OROMUCOSAL | 0 refills | Status: DC
  Filled 2023-02-10: qty 100, 5d supply, fill #0

## 2023-02-12 ENCOUNTER — Other Ambulatory Visit (HOSPITAL_COMMUNITY): Payer: Self-pay

## 2023-02-13 ENCOUNTER — Other Ambulatory Visit (HOSPITAL_COMMUNITY): Payer: Self-pay

## 2023-02-14 ENCOUNTER — Other Ambulatory Visit (HOSPITAL_COMMUNITY): Payer: Self-pay

## 2023-02-15 ENCOUNTER — Other Ambulatory Visit (HOSPITAL_COMMUNITY): Payer: Self-pay

## 2023-02-15 MED ORDER — HYDROCODONE-ACETAMINOPHEN 10-325 MG PO TABS
1.0000 | ORAL_TABLET | Freq: Two times a day (BID) | ORAL | 0 refills | Status: DC
  Filled 2023-02-15: qty 60, 30d supply, fill #0

## 2023-02-16 ENCOUNTER — Other Ambulatory Visit (HOSPITAL_COMMUNITY): Payer: Self-pay

## 2023-02-16 MED ORDER — NYSTATIN 100000 UNIT/ML MT SUSP
OROMUCOSAL | 0 refills | Status: DC
  Filled 2023-02-16: qty 480, 12d supply, fill #0

## 2023-02-17 ENCOUNTER — Other Ambulatory Visit (HOSPITAL_COMMUNITY): Payer: Self-pay

## 2023-03-08 ENCOUNTER — Other Ambulatory Visit (HOSPITAL_COMMUNITY): Payer: Self-pay

## 2023-03-08 MED ORDER — LOSARTAN POTASSIUM 25 MG PO TABS
25.0000 mg | ORAL_TABLET | Freq: Every day | ORAL | 1 refills | Status: DC
  Filled 2023-03-08: qty 90, 90d supply, fill #0
  Filled 2023-06-04: qty 90, 90d supply, fill #1

## 2023-03-08 MED ORDER — CLOPIDOGREL BISULFATE 75 MG PO TABS
75.0000 mg | ORAL_TABLET | Freq: Every day | ORAL | 1 refills | Status: DC
  Filled 2023-03-08: qty 90, 90d supply, fill #0
  Filled 2023-06-13: qty 90, 90d supply, fill #1

## 2023-03-09 ENCOUNTER — Other Ambulatory Visit: Payer: Self-pay

## 2023-03-09 ENCOUNTER — Other Ambulatory Visit (HOSPITAL_COMMUNITY): Payer: Self-pay

## 2023-03-09 MED ORDER — ALBUTEROL SULFATE HFA 108 (90 BASE) MCG/ACT IN AERS
2.0000 | INHALATION_SPRAY | Freq: Four times a day (QID) | RESPIRATORY_TRACT | 6 refills | Status: DC | PRN
  Filled 2023-03-09: qty 6.7, 25d supply, fill #0

## 2023-03-09 MED ORDER — HYDROCODONE-ACETAMINOPHEN 10-325 MG PO TABS
1.0000 | ORAL_TABLET | Freq: Two times a day (BID) | ORAL | 0 refills | Status: DC
  Filled 2023-03-09 – 2023-03-15 (×2): qty 60, 30d supply, fill #0

## 2023-03-10 ENCOUNTER — Other Ambulatory Visit (HOSPITAL_COMMUNITY): Payer: Self-pay

## 2023-03-12 ENCOUNTER — Other Ambulatory Visit (HOSPITAL_COMMUNITY): Payer: Self-pay

## 2023-03-12 MED ORDER — ALBUTEROL SULFATE HFA 108 (90 BASE) MCG/ACT IN AERS
2.0000 | INHALATION_SPRAY | Freq: Four times a day (QID) | RESPIRATORY_TRACT | 6 refills | Status: DC | PRN
  Filled 2023-03-12 – 2023-09-13 (×2): qty 6.7, 25d supply, fill #0

## 2023-03-13 ENCOUNTER — Other Ambulatory Visit (HOSPITAL_COMMUNITY): Payer: Self-pay

## 2023-03-15 ENCOUNTER — Other Ambulatory Visit (HOSPITAL_COMMUNITY): Payer: Self-pay

## 2023-04-05 ENCOUNTER — Other Ambulatory Visit (HOSPITAL_COMMUNITY): Payer: Self-pay

## 2023-04-05 MED ORDER — FAMOTIDINE 20 MG PO TABS
20.0000 mg | ORAL_TABLET | Freq: Two times a day (BID) | ORAL | 0 refills | Status: DC
  Filled 2023-04-05: qty 180, 90d supply, fill #0

## 2023-04-06 ENCOUNTER — Other Ambulatory Visit (HOSPITAL_COMMUNITY): Payer: Self-pay

## 2023-04-13 ENCOUNTER — Other Ambulatory Visit (HOSPITAL_COMMUNITY): Payer: Self-pay

## 2023-04-14 ENCOUNTER — Other Ambulatory Visit (HOSPITAL_COMMUNITY): Payer: Self-pay

## 2023-04-16 ENCOUNTER — Other Ambulatory Visit (HOSPITAL_COMMUNITY): Payer: Self-pay

## 2023-04-16 MED ORDER — HYDROCODONE-ACETAMINOPHEN 10-325 MG PO TABS
1.0000 | ORAL_TABLET | Freq: Two times a day (BID) | ORAL | 0 refills | Status: DC
  Filled 2023-04-16: qty 60, 30d supply, fill #0

## 2023-04-18 ENCOUNTER — Other Ambulatory Visit (HOSPITAL_COMMUNITY): Payer: Self-pay

## 2023-04-20 ENCOUNTER — Other Ambulatory Visit (HOSPITAL_COMMUNITY): Payer: Self-pay

## 2023-04-30 ENCOUNTER — Other Ambulatory Visit (HOSPITAL_COMMUNITY): Payer: Self-pay

## 2023-05-08 ENCOUNTER — Other Ambulatory Visit (HOSPITAL_COMMUNITY): Payer: Self-pay

## 2023-05-08 MED ORDER — HYDROCODONE-ACETAMINOPHEN 10-325 MG PO TABS
1.0000 | ORAL_TABLET | Freq: Two times a day (BID) | ORAL | 0 refills | Status: DC
  Filled 2023-05-14 – 2023-05-17 (×2): qty 60, 30d supply, fill #0

## 2023-05-09 ENCOUNTER — Other Ambulatory Visit (HOSPITAL_COMMUNITY): Payer: Self-pay

## 2023-05-09 ENCOUNTER — Other Ambulatory Visit: Payer: Self-pay

## 2023-05-09 MED ORDER — ALUM & MAG HYDROXIDE-SIMETH 200-200-20 MG/5ML PO SUSP
ORAL | 5 refills | Status: AC
  Filled 2023-05-09 (×2): qty 360, 6d supply, fill #0
  Filled 2023-08-07: qty 360, 6d supply, fill #1
  Filled 2023-11-27: qty 360, 6d supply, fill #2

## 2023-05-14 ENCOUNTER — Other Ambulatory Visit (HOSPITAL_COMMUNITY): Payer: Self-pay

## 2023-05-14 ENCOUNTER — Other Ambulatory Visit: Payer: Self-pay

## 2023-05-17 ENCOUNTER — Other Ambulatory Visit (HOSPITAL_COMMUNITY): Payer: Self-pay

## 2023-05-26 ENCOUNTER — Other Ambulatory Visit (HOSPITAL_COMMUNITY): Payer: Self-pay

## 2023-05-26 MED ORDER — GABAPENTIN 300 MG PO CAPS
300.0000 mg | ORAL_CAPSULE | Freq: Three times a day (TID) | ORAL | 2 refills | Status: DC
  Filled 2023-05-26: qty 90, 30d supply, fill #0
  Filled 2023-07-11 (×2): qty 90, 30d supply, fill #1

## 2023-06-04 ENCOUNTER — Other Ambulatory Visit (HOSPITAL_COMMUNITY): Payer: Self-pay

## 2023-06-04 MED ORDER — SERTRALINE HCL 50 MG PO TABS
50.0000 mg | ORAL_TABLET | Freq: Every day | ORAL | 1 refills | Status: DC
  Filled 2023-06-04: qty 90, 90d supply, fill #0

## 2023-06-05 ENCOUNTER — Other Ambulatory Visit (HOSPITAL_COMMUNITY): Payer: Self-pay

## 2023-06-13 ENCOUNTER — Other Ambulatory Visit (HOSPITAL_COMMUNITY): Payer: Self-pay

## 2023-06-15 ENCOUNTER — Other Ambulatory Visit (HOSPITAL_COMMUNITY): Payer: Self-pay

## 2023-06-16 ENCOUNTER — Other Ambulatory Visit (HOSPITAL_COMMUNITY): Payer: Self-pay

## 2023-06-16 MED ORDER — HYDROCODONE-ACETAMINOPHEN 10-325 MG PO TABS
1.0000 | ORAL_TABLET | Freq: Two times a day (BID) | ORAL | 0 refills | Status: DC
  Filled 2023-06-16: qty 60, 30d supply, fill #0

## 2023-07-09 ENCOUNTER — Other Ambulatory Visit: Payer: Self-pay

## 2023-07-09 ENCOUNTER — Other Ambulatory Visit (HOSPITAL_COMMUNITY): Payer: Self-pay

## 2023-07-09 MED ORDER — CARVEDILOL 6.25 MG PO TABS
6.2500 mg | ORAL_TABLET | Freq: Two times a day (BID) | ORAL | 1 refills | Status: AC
  Filled 2023-07-09: qty 180, 90d supply, fill #0
  Filled 2023-10-09: qty 180, 90d supply, fill #1

## 2023-07-09 MED ORDER — BACLOFEN 10 MG PO TABS
10.0000 mg | ORAL_TABLET | Freq: Three times a day (TID) | ORAL | 3 refills | Status: DC | PRN
  Filled 2023-07-09: qty 60, 20d supply, fill #0

## 2023-07-09 MED ORDER — MONTELUKAST SODIUM 10 MG PO TABS
10.0000 mg | ORAL_TABLET | Freq: Every day | ORAL | 1 refills | Status: DC
  Filled 2023-07-09: qty 90, 90d supply, fill #0
  Filled 2023-10-09: qty 90, 90d supply, fill #1

## 2023-07-09 MED ORDER — SPIRONOLACTONE 25 MG PO TABS
12.5000 mg | ORAL_TABLET | Freq: Every day | ORAL | 1 refills | Status: DC
  Filled 2023-07-09: qty 45, 90d supply, fill #0
  Filled 2023-10-09: qty 45, 90d supply, fill #1

## 2023-07-09 MED ORDER — HYDROCODONE-ACETAMINOPHEN 10-325 MG PO TABS
1.0000 | ORAL_TABLET | Freq: Two times a day (BID) | ORAL | 0 refills | Status: DC
  Filled 2023-07-09 – 2023-07-17 (×2): qty 60, 30d supply, fill #0

## 2023-07-11 ENCOUNTER — Other Ambulatory Visit (HOSPITAL_COMMUNITY): Payer: Self-pay

## 2023-07-11 ENCOUNTER — Other Ambulatory Visit: Payer: Self-pay

## 2023-07-11 MED ORDER — FAMOTIDINE 20 MG PO TABS
20.0000 mg | ORAL_TABLET | Freq: Two times a day (BID) | ORAL | 0 refills | Status: DC
  Filled 2023-07-11: qty 180, 90d supply, fill #0

## 2023-07-14 ENCOUNTER — Other Ambulatory Visit (HOSPITAL_COMMUNITY): Payer: Self-pay

## 2023-07-17 ENCOUNTER — Other Ambulatory Visit (HOSPITAL_COMMUNITY): Payer: Self-pay

## 2023-08-07 ENCOUNTER — Other Ambulatory Visit (HOSPITAL_COMMUNITY): Payer: Self-pay

## 2023-08-07 MED ORDER — ATORVASTATIN CALCIUM 80 MG PO TABS
80.0000 mg | ORAL_TABLET | Freq: Every day | ORAL | 1 refills | Status: DC
  Filled 2023-08-07: qty 90, 90d supply, fill #0

## 2023-08-08 ENCOUNTER — Other Ambulatory Visit (HOSPITAL_COMMUNITY): Payer: Self-pay

## 2023-08-08 MED ORDER — ATORVASTATIN CALCIUM 80 MG PO TABS
80.0000 mg | ORAL_TABLET | Freq: Every day | ORAL | 1 refills | Status: DC
  Filled 2023-08-08: qty 90, 90d supply, fill #0

## 2023-08-13 ENCOUNTER — Other Ambulatory Visit (HOSPITAL_COMMUNITY): Payer: Self-pay

## 2023-08-15 ENCOUNTER — Other Ambulatory Visit (HOSPITAL_COMMUNITY): Payer: Self-pay

## 2023-08-15 MED ORDER — VITAMIN D (ERGOCALCIFEROL) 1.25 MG (50000 UNIT) PO CAPS
50000.0000 [IU] | ORAL_CAPSULE | ORAL | 2 refills | Status: DC
  Filled 2023-08-15: qty 5, 35d supply, fill #0
  Filled 2023-10-03: qty 5, 35d supply, fill #1

## 2023-08-15 MED ORDER — GABAPENTIN 300 MG PO CAPS
300.0000 mg | ORAL_CAPSULE | Freq: Three times a day (TID) | ORAL | 2 refills | Status: DC
  Filled 2023-08-15 (×2): qty 90, 30d supply, fill #0
  Filled 2023-10-09: qty 90, 30d supply, fill #1
  Filled 2023-12-18: qty 90, 30d supply, fill #2

## 2023-08-15 MED ORDER — BACLOFEN 10 MG PO TABS
10.0000 mg | ORAL_TABLET | Freq: Three times a day (TID) | ORAL | 3 refills | Status: AC | PRN
  Filled 2023-08-15 (×2): qty 60, 20d supply, fill #0

## 2023-08-15 MED ORDER — HYDROCODONE-ACETAMINOPHEN 10-325 MG PO TABS
1.0000 | ORAL_TABLET | Freq: Two times a day (BID) | ORAL | 0 refills | Status: DC
  Filled 2023-08-15: qty 60, 30d supply, fill #0

## 2023-08-29 ENCOUNTER — Other Ambulatory Visit (HOSPITAL_COMMUNITY): Payer: Self-pay

## 2023-08-29 MED ORDER — AMOXICILLIN 500 MG PO CAPS
500.0000 mg | ORAL_CAPSULE | Freq: Three times a day (TID) | ORAL | 0 refills | Status: DC
  Filled 2023-08-29: qty 30, 10d supply, fill #0

## 2023-09-10 ENCOUNTER — Other Ambulatory Visit (HOSPITAL_COMMUNITY): Payer: Self-pay

## 2023-09-10 MED ORDER — DICYCLOMINE HCL 10 MG PO CAPS
10.0000 mg | ORAL_CAPSULE | Freq: Three times a day (TID) | ORAL | 5 refills | Status: DC
  Filled 2023-09-10: qty 120, 30d supply, fill #0

## 2023-09-10 MED ORDER — HYDROCODONE-ACETAMINOPHEN 10-325 MG PO TABS
1.0000 | ORAL_TABLET | Freq: Two times a day (BID) | ORAL | 0 refills | Status: DC
  Filled 2023-09-10 – 2023-09-12 (×2): qty 60, 30d supply, fill #0

## 2023-09-10 MED ORDER — SERTRALINE HCL 50 MG PO TABS
50.0000 mg | ORAL_TABLET | Freq: Every day | ORAL | 1 refills | Status: DC
  Filled 2023-09-10: qty 90, 90d supply, fill #0

## 2023-09-10 MED ORDER — LOSARTAN POTASSIUM 25 MG PO TABS
25.0000 mg | ORAL_TABLET | Freq: Every day | ORAL | 1 refills | Status: DC
  Filled 2023-09-10: qty 90, 90d supply, fill #0
  Filled 2023-12-14: qty 90, 90d supply, fill #1

## 2023-09-12 ENCOUNTER — Other Ambulatory Visit (HOSPITAL_COMMUNITY): Payer: Self-pay

## 2023-09-13 ENCOUNTER — Other Ambulatory Visit (HOSPITAL_COMMUNITY): Payer: Self-pay

## 2023-10-01 ENCOUNTER — Other Ambulatory Visit (HOSPITAL_COMMUNITY): Payer: Self-pay

## 2023-10-01 MED ORDER — AMOXICILLIN 500 MG PO CAPS
500.0000 mg | ORAL_CAPSULE | Freq: Three times a day (TID) | ORAL | 0 refills | Status: DC
  Filled 2023-10-01: qty 21, 7d supply, fill #0

## 2023-10-01 MED ORDER — CHLORHEXIDINE GLUCONATE 0.12 % MT SOLN
OROMUCOSAL | 0 refills | Status: DC
  Filled 2023-10-01: qty 473, 10d supply, fill #0

## 2023-10-01 MED ORDER — ACETAMINOPHEN-CODEINE 300-30 MG PO TABS
1.0000 | ORAL_TABLET | Freq: Four times a day (QID) | ORAL | 0 refills | Status: DC | PRN
  Filled 2023-10-01: qty 10, 3d supply, fill #0

## 2023-10-01 MED ORDER — IBUPROFEN 800 MG PO TABS
800.0000 mg | ORAL_TABLET | Freq: Three times a day (TID) | ORAL | 1 refills | Status: DC | PRN
  Filled 2023-10-01: qty 20, 7d supply, fill #0
  Filled 2023-10-09: qty 20, 7d supply, fill #1

## 2023-10-03 ENCOUNTER — Other Ambulatory Visit (HOSPITAL_COMMUNITY): Payer: Self-pay

## 2023-10-09 ENCOUNTER — Other Ambulatory Visit (HOSPITAL_COMMUNITY): Payer: Self-pay

## 2023-10-09 ENCOUNTER — Other Ambulatory Visit: Payer: Self-pay

## 2023-10-09 MED ORDER — FAMOTIDINE 20 MG PO TABS
20.0000 mg | ORAL_TABLET | Freq: Two times a day (BID) | ORAL | 0 refills | Status: AC
  Filled 2023-10-09: qty 180, 90d supply, fill #0

## 2023-10-15 ENCOUNTER — Other Ambulatory Visit (HOSPITAL_COMMUNITY): Payer: Self-pay

## 2023-10-15 MED ORDER — HYDROCODONE-ACETAMINOPHEN 10-325 MG PO TABS
1.0000 | ORAL_TABLET | Freq: Two times a day (BID) | ORAL | 0 refills | Status: DC
  Filled 2023-10-15: qty 60, 30d supply, fill #0

## 2023-10-16 ENCOUNTER — Other Ambulatory Visit (HOSPITAL_COMMUNITY): Payer: Self-pay

## 2023-10-24 ENCOUNTER — Other Ambulatory Visit: Payer: Self-pay

## 2023-10-24 ENCOUNTER — Other Ambulatory Visit (HOSPITAL_COMMUNITY): Payer: Self-pay

## 2023-10-24 MED ORDER — NA SULFATE-K SULFATE-MG SULF 17.5-3.13-1.6 GM/177ML PO SOLN
ORAL | 0 refills | Status: DC
  Filled 2023-10-24: qty 354, 30d supply, fill #0
  Filled 2023-10-24: qty 355, 1d supply, fill #0
  Filled 2023-11-10: qty 354, 1d supply, fill #0

## 2023-11-05 ENCOUNTER — Other Ambulatory Visit (HOSPITAL_COMMUNITY): Payer: Self-pay

## 2023-11-09 ENCOUNTER — Other Ambulatory Visit (HOSPITAL_COMMUNITY): Payer: Self-pay

## 2023-11-09 ENCOUNTER — Other Ambulatory Visit: Payer: Self-pay

## 2023-11-09 MED ORDER — ATORVASTATIN CALCIUM 80 MG PO TABS
80.0000 mg | ORAL_TABLET | Freq: Every day | ORAL | 1 refills | Status: DC
  Filled 2023-11-09: qty 90, 90d supply, fill #0

## 2023-11-09 MED ORDER — ALBUTEROL SULFATE HFA 108 (90 BASE) MCG/ACT IN AERS
2.0000 | INHALATION_SPRAY | Freq: Four times a day (QID) | RESPIRATORY_TRACT | 6 refills | Status: AC | PRN
  Filled 2023-11-09: qty 6.7, 25d supply, fill #0

## 2023-11-09 MED ORDER — VITAMIN D (ERGOCALCIFEROL) 1.25 MG (50000 UNIT) PO CAPS
50000.0000 [IU] | ORAL_CAPSULE | ORAL | 2 refills | Status: AC
  Filled 2023-11-09: qty 5, 35d supply, fill #0

## 2023-11-09 MED ORDER — HYDROCODONE-ACETAMINOPHEN 10-325 MG PO TABS
1.0000 | ORAL_TABLET | Freq: Two times a day (BID) | ORAL | 0 refills | Status: DC
  Filled 2023-11-09 – 2023-11-13 (×3): qty 60, 30d supply, fill #0

## 2023-11-09 MED ORDER — SERTRALINE HCL 50 MG PO TABS
50.0000 mg | ORAL_TABLET | Freq: Every day | ORAL | 1 refills | Status: AC
  Filled 2023-11-09 – 2023-12-14 (×2): qty 90, 90d supply, fill #0

## 2023-11-09 MED ORDER — LOSARTAN POTASSIUM 25 MG PO TABS
25.0000 mg | ORAL_TABLET | Freq: Every day | ORAL | 1 refills | Status: DC
  Filled 2023-11-09: qty 90, 90d supply, fill #0

## 2023-11-10 ENCOUNTER — Other Ambulatory Visit (HOSPITAL_COMMUNITY): Payer: Self-pay

## 2023-11-12 ENCOUNTER — Other Ambulatory Visit (HOSPITAL_COMMUNITY): Payer: Self-pay

## 2023-11-12 ENCOUNTER — Other Ambulatory Visit: Payer: Self-pay

## 2023-11-13 ENCOUNTER — Other Ambulatory Visit (HOSPITAL_COMMUNITY): Payer: Self-pay

## 2023-11-16 ENCOUNTER — Other Ambulatory Visit (HOSPITAL_COMMUNITY): Payer: Self-pay

## 2023-11-27 ENCOUNTER — Other Ambulatory Visit (HOSPITAL_COMMUNITY): Payer: Self-pay

## 2023-12-06 ENCOUNTER — Encounter (HOSPITAL_COMMUNITY): Payer: Self-pay | Admitting: Emergency Medicine

## 2023-12-06 ENCOUNTER — Other Ambulatory Visit: Payer: Self-pay

## 2023-12-06 ENCOUNTER — Emergency Department (HOSPITAL_COMMUNITY)

## 2023-12-06 ENCOUNTER — Inpatient Hospital Stay (HOSPITAL_COMMUNITY)
Admission: EM | Admit: 2023-12-06 | Discharge: 2023-12-08 | DRG: 063 | Disposition: A | Attending: Neurology | Admitting: Neurology

## 2023-12-06 DIAGNOSIS — I10 Essential (primary) hypertension: Secondary | ICD-10-CM | POA: Diagnosis present

## 2023-12-06 DIAGNOSIS — I639 Cerebral infarction, unspecified: Principal | ICD-10-CM | POA: Diagnosis present

## 2023-12-06 DIAGNOSIS — E785 Hyperlipidemia, unspecified: Secondary | ICD-10-CM | POA: Diagnosis present

## 2023-12-06 DIAGNOSIS — F17200 Nicotine dependence, unspecified, uncomplicated: Secondary | ICD-10-CM | POA: Diagnosis present

## 2023-12-06 LAB — APTT: aPTT: 35 s (ref 24–36)

## 2023-12-06 LAB — I-STAT CHEM 8, ED
BUN: 16 mg/dL (ref 8–23)
Calcium, Ion: 1 mmol/L — ABNORMAL LOW (ref 1.15–1.40)
Chloride: 108 mmol/L (ref 98–111)
Creatinine, Ser: 1 mg/dL (ref 0.61–1.24)
Glucose, Bld: 158 mg/dL — ABNORMAL HIGH (ref 70–99)
HCT: 44 % (ref 39.0–52.0)
Hemoglobin: 15 g/dL (ref 13.0–17.0)
Potassium: 4.8 mmol/L (ref 3.5–5.1)
Sodium: 138 mmol/L (ref 135–145)
TCO2: 23 mmol/L (ref 22–32)

## 2023-12-06 LAB — COMPREHENSIVE METABOLIC PANEL WITH GFR
ALT: 18 U/L (ref 0–44)
AST: 19 U/L (ref 15–41)
Albumin: 3.7 g/dL (ref 3.5–5.0)
Alkaline Phosphatase: 90 U/L (ref 38–126)
Anion gap: 10 (ref 5–15)
BUN: 12 mg/dL (ref 8–23)
CO2: 23 mmol/L (ref 22–32)
Calcium: 8.8 mg/dL — ABNORMAL LOW (ref 8.9–10.3)
Chloride: 105 mmol/L (ref 98–111)
Creatinine, Ser: 0.95 mg/dL (ref 0.61–1.24)
GFR, Estimated: >60 mL/min (ref >60)
Glucose, Bld: 160 mg/dL — ABNORMAL HIGH (ref 70–99)
Potassium: 3.8 mmol/L (ref 3.5–5.1)
Sodium: 138 mmol/L (ref 135–145)
Total Bilirubin: 0.7 mg/dL (ref 0.0–1.2)
Total Protein: 7.1 g/dL (ref 6.5–8.1)

## 2023-12-06 LAB — CBC
HCT: 42.9 % (ref 39.0–52.0)
Hemoglobin: 14.7 g/dL (ref 13.0–17.0)
MCH: 30.4 pg (ref 26.0–34.0)
MCHC: 34.3 g/dL (ref 30.0–36.0)
MCV: 88.8 fL (ref 80.0–100.0)
Platelets: 256 K/uL (ref 150–400)
RBC: 4.83 MIL/uL (ref 4.22–5.81)
RDW: 14.5 % (ref 11.5–15.5)
WBC: 10.3 K/uL (ref 4.0–10.5)
nRBC: 0 % (ref 0.0–0.2)

## 2023-12-06 LAB — PROTIME-INR
INR: 1 (ref 0.8–1.2)
Prothrombin Time: 13.9 s (ref 11.4–15.2)

## 2023-12-06 LAB — DIFFERENTIAL
Abs Immature Granulocytes: 0.03 K/uL (ref 0.00–0.07)
Basophils Absolute: 0.1 K/uL (ref 0.0–0.1)
Basophils Relative: 1 %
Eosinophils Absolute: 0.1 K/uL (ref 0.0–0.5)
Eosinophils Relative: 1 %
Immature Granulocytes: 0 %
Lymphocytes Relative: 27 %
Lymphs Abs: 2.8 K/uL (ref 0.7–4.0)
Monocytes Absolute: 0.8 K/uL (ref 0.1–1.0)
Monocytes Relative: 8 %
Neutro Abs: 6.5 K/uL (ref 1.7–7.7)
Neutrophils Relative %: 63 %

## 2023-12-06 LAB — ETHANOL: Alcohol, Ethyl (B): <15 mg/dL (ref <15)

## 2023-12-06 MED ORDER — TENECTEPLASE 25 MG IV KIT
0.2500 mg/kg | PACK | Freq: Once | INTRAVENOUS | Status: AC
  Administered 2023-12-06: 18 mg via INTRAVENOUS
  Filled 2023-12-06: qty 5

## 2023-12-06 NOTE — ED Provider Notes (Signed)
 [place] [facility]PARTMENT AT [facility] Provider Note   CSN: [id number] Arrival date & time: 12/06/23  8044     Patient presents with: Numbness   Anthony Newman is a 68 y.o. male.   68 yo M with a chief complaint of right leg weakness.  Started about an hour prior to arrival.  Patient was made a code stroke in triage.  Level 5 caveat acuity of condition.        Prior to Admission medications   Medication Sig Start Date End Date Taking? Authorizing Provider  acetaminophen -codeine  (TYLENOL  #3) 300-30 MG tablet Take 1 tablet by mouth every 6 (six) hours as needed for dental pain 10/01/23     albuterol  (VENTOLIN  HFA) 108 (90 Base) MCG/ACT inhaler INHALE 2 PUFFS INTO THE LUNGS EVERY 6 HOURS AS NEEDED 09/01/19 09/03/20  Bulla, Nancyann, PA-C  albuterol  (VENTOLIN  HFA) 108 (90 Base) MCG/ACT inhaler Inhale 2 puffs by mouth every 6 hours as needed 01/11/21     albuterol  (VENTOLIN  HFA) 108 (90 Base) MCG/ACT inhaler Inhale 2 puffs into the lungs every 6 (six) hours as needed. 09/05/22     albuterol  (VENTOLIN  HFA) 108 (90 Base) MCG/ACT inhaler Inhale 2 puffs into the lungs every 6 (six) hours as needed. 03/09/23     albuterol  (VENTOLIN  HFA) 108 (90 Base) MCG/ACT inhaler Inhale 2 puffs into the lungs every 6 (six) hours as needed for wheezing 03/09/23     albuterol  (VENTOLIN  HFA) 108 (90 Base) MCG/ACT inhaler Inhale 2 puffs into the lungs every 6 (six) hours as needed. 11/09/23     amoxicillin  (AMOXIL ) 500 MG capsule Take 1 capsule (500 mg total) by mouth every 8 (eight) hours until gone. 08/29/23     amoxicillin  (AMOXIL ) 500 MG capsule Take 1 capsule (500 mg total) by mouth every 8 (eight) hours. 10/01/23     aspirin  EC 81 MG tablet Take 81 mg by mouth daily.     [provider]  atorvastatin  (LIPITOR ) 80 MG tablet Take 1 tablet by mouth daily 11/11/20     atorvastatin  (LIPITOR ) 80 MG tablet Take 1 tablet (80 mg total) by mouth daily. 12/02/21     atorvastatin  (LIPITOR ) 80 MG tablet  Take 1 tablet (80 mg total) by mouth daily. 04/14/22     atorvastatin  (LIPITOR ) 80 MG tablet Take 1 tablet (80 mg total) by mouth daily. 08/07/23     atorvastatin  (LIPITOR ) 80 MG tablet Take 1 tablet (80 mg total) by mouth daily. 08/07/23     atorvastatin  (LIPITOR ) 80 MG tablet Take 1 tablet (80 mg total) by mouth daily. 08/07/23     atorvastatin  (LIPITOR ) 80 MG tablet Take 1 tablet (80 mg total) by mouth daily. 11/09/23     azithromycin  (ZITHROMAX ) 250 MG tablet Take 2 tablets by mouth on day 1 then take 1 tablet daily until finished 01/29/23     baclofen  (LIORESAL ) 10 MG tablet Take 1 tablet (10 mg total) by mouth every 8 (eight) hours as needed for muscle spasm 06/03/21     baclofen  (LIORESAL ) 10 MG tablet Take 1 tablet (10 mg total) by mouth every 8 (eight) hours as needed for muscle spasm 10/13/22     baclofen  (LIORESAL ) 10 MG tablet Take 1 tablet (10 mg total) by mouth every 8 (eight) hours as needed for muscle spasms 07/09/23     baclofen  (LIORESAL ) 10 MG tablet Take 1 tablet (10 mg total) by mouth every 8 (eight) hours as needed for muscle spasm. 08/15/23  carvedilol  (COREG ) 6.25 MG tablet Take 1 tablet by mouth 2 times a day 08/09/20     carvedilol  (COREG ) 6.25 MG tablet Take 1 tablet by mouth two times daily for hypertension 11/11/20     carvedilol  (COREG ) 6.25 MG tablet Take 1 tablet by mouth two times daily 02/14/21     carvedilol  (COREG ) 6.25 MG tablet Take 1 tablet (6.25 mg total) by mouth 2 (two) times daily for hypertension. 06/03/21     carvedilol  (COREG ) 6.25 MG tablet Take 1 tablet (6.25 mg total) by mouth 2 (two) times daily. 01/08/23     carvedilol  (COREG ) 6.25 MG tablet Take 1 tablet (6.25 mg total) by mouth 2 (two) times daily. 07/09/23     cefdinir  (OMNICEF ) 300 MG capsule Take 1 capsule (300 mg total) by mouth 2 (two) times daily. 04/02/21     cefdinir  (OMNICEF ) 300 MG capsule Take 1 capsule (300 mg total) by mouth 2 (two) times daily. 09/25/21     chlorhexidine  (PERIDEX ) 0.12 % solution Swish  for one minute and expectorate twice daily for 10 days. 10/01/23     clopidogrel  (PLAVIX ) 75 MG tablet Take 1 tablet by mouth daily. 11/11/20     clopidogrel  (PLAVIX ) 75 MG tablet Take 1 tablet (75 mg total) by mouth daily. 01/13/22     clopidogrel  (PLAVIX ) 75 MG tablet Take 1 tablet (75 mg total) by mouth daily. 03/08/23     dicyclomine  (BENTYL ) 10 MG capsule Take 10 mg by mouth 3 (three) times daily as needed for spasms.    [provider]  dicyclomine  (BENTYL ) 10 MG capsule TAKE 1 CAPSULE BY MOUTH THREE TIMES DAILY BEFORE MEALS AND A BEDTIME FOR PAIN AND CRAMPING 11/12/19 11/11/20  Neita Dover, PA-C  dicyclomine  (BENTYL ) 10 MG capsule Take 1 capsule by mouth 3 times daily before meals and at bedtime for pain and cramping. 10/06/20     dicyclomine  (BENTYL ) 10 MG capsule Take 1 capsule (10 mg total) by mouth 3 (three) times daily before meals and at bedtime. 09/25/21     dicyclomine  (BENTYL ) 10 MG capsule Take 1 capsule (10 mg total) by mouth 3 (three) times daily - before meals and at bedtime. 01/08/23     dicyclomine  (BENTYL ) 10 MG capsule Take 1 capsule (10 mg total) by mouth 4 (four) times daily -  before meals and at bedtime. 09/10/23     doxycycline  (VIBRAMYCIN ) 100 MG capsule Take 1 capsule (100 mg total) by mouth 2 (two) times daily. 04/14/22     doxycycline  (VIBRAMYCIN ) 50 MG capsule Take 1 capsule (50 mg total) by mouth 2 (two) times daily for antibiotic 08/15/22     EPINEPHrine  0.3 mg/0.3 mL IJ SOAJ injection Inject as needed for severe reaction, repeat in 3 minutes if not improving 06/03/21     famotidine  (PEPCID ) 20 MG tablet Take 1 tablet (20 mg total) by mouth 2 (two) times daily. 10/09/23     gabapentin  (NEURONTIN ) 300 MG capsule Take 1 capsule (300 mg total) by mouth 3 (three) times daily. 04/07/20     gabapentin  (NEURONTIN ) 300 MG capsule Take 1 capsule by mouth three times daily for chest wall/neck and back pain. 06/28/21     gabapentin  (NEURONTIN ) 300 MG capsule Take 1 capsule (300  mg total) by mouth 3 (three) times daily as needed for chronic back and neck pain. 01/12/23     gabapentin  (NEURONTIN ) 300 MG capsule Take 1 capsule (300 mg total) by mouth 3 (three) times daily. 05/26/23  gabapentin  (NEURONTIN ) 300 MG capsule Take 1 capsule (300 mg total) by mouth 3 (three) times daily for chronic back and neck pain. 08/15/23   Ramsey, Timothy A, NP  HYDROcodone  bit-homatropine (HYDROMET) 5-1.5 MG/5ML syrup Take 5 mL by mouth every 4 to 6 hours as needed for cough 08/11/22     HYDROcodone -acetaminophen  (NORCO) 10-325 MG per tablet Take 0.5-1 tablets by mouth every 6 (six) hours as needed.    [provider]  HYDROcodone -acetaminophen  (NORCO) 10-325 MG tablet Take 1 (one) Tablet by mouth two times daily, max daily dose: 2 Tablets 04/07/20     HYDROcodone -acetaminophen  (NORCO) 10-325 MG tablet Take 1 (one) Tablet by mouth two times daily, max daily dose: 2 Tablets 06/07/20     HYDROcodone -acetaminophen  (NORCO) 10-325 MG tablet Take 1 (one) Tablet by mouth two times daily, max daily dose: 2 Tablet 07/09/20     HYDROcodone -acetaminophen  (NORCO) 10-325 MG tablet Take 1 tablet by mouth two times daily (max 2 per day) 11/11/20     HYDROcodone -acetaminophen  (NORCO) 10-325 MG tablet Take 1 tablet by mouth 2 times daily, max daily dose: 2 tablets 01/11/21     HYDROcodone -acetaminophen  (NORCO) 10-325 MG tablet Take 1 tablet by mouth 2 (two) times daily. 11/09/23     ibuprofen  (ADVIL ) 800 MG tablet Take 1 tablet (800 mg total) by mouth 3 (three) times daily. 04/09/21     ibuprofen  (ADVIL ) 800 MG tablet Take 1 tablet every 8 hours as needed for pain 10/01/23     GI Cocktail (alum & mag hydroxide-simethicone /lidocaine )oral mixture Take 15 mL (1 tablespoonful) every 6 hours as needed for acid reflux pain. 05/08/23     lidocaine  in alum & mag hydroxide-simeth suspension Take 15 ml (1 tablespoonful) by mouth as directed every 3 hours as needed for severe heartburn. **Shake well before each use.** 09/09/20      lidocaine -alum & mag hydroxide-simeth Take 1-2 tablespoonfuls by mouth every 3 hours as needed for esophageal spasms/pain 04/30/20     lidocaine -alum & mag hydroxide-simeth Take 30 mls by mouth every 4 hours as needed 10/14/21     lidocaine -alum & mag hydroxide-simeth Use as directed 15-30 mLs in the mouth or throat every 3 (three) hours as needed for servere heartburn. (shake well before using) 01/10/22     losartan  (COZAAR ) 25 MG tablet Take 1 tablet by mouth daily. Discontinue quinapril  06/28/21     losartan  (COZAAR ) 25 MG tablet Take 1 tablet (25 mg total) by mouth daily. Discontinue quinapril  03/08/23     losartan  (COZAAR ) 25 MG tablet Take 1 tablet (25 mg total) by mouth daily. Discontinue quinapril  09/10/23     losartan  (COZAAR ) 25 MG tablet Take 1 tablet (25 mg total) by mouth daily. 11/09/23     methocarbamol  (ROBAXIN ) 500 MG tablet Take 1 tablet (500 mg total) by mouth 3 (three) times daily. 04/09/21     montelukast  (SINGULAIR ) 10 MG tablet Take 1 tablet (10 mg total) by mouth daily for chronic allergies. 04/07/20     montelukast  (SINGULAIR ) 10 MG tablet Take 1 tablet by mouth daily for chronic allergies 12/18/20     montelukast  (SINGULAIR ) 10 MG tablet Take 1 tablet by mouth daily for chronic allergies 09/07/21     montelukast  (SINGULAIR ) 10 MG tablet Take 1 tablet (10 mg total) by mouth daily for chronic allergies 04/28/22     montelukast  (SINGULAIR ) 10 MG tablet Take 1 tablet (10 mg total) by mouth daily for chronic allergies. 04/28/22     montelukast  (SINGULAIR ) 10  MG tablet Take 1 tablet (10 mg total) by mouth daily for chronic allergies 09/05/22     montelukast  (SINGULAIR ) 10 MG tablet Take 1 tablet (10 mg total) by mouth daily for chronic allergies. 07/09/23     Na Sulfate-K Sulfate-Mg Sulf (SUPREP BOWEL PREP KIT) 17.5-3.13-1.6 GM/177ML SOLN Use as directed per prep sheet 10/28/20     Na Sulfate-K Sulfate-Mg Sulfate concentrate (SUPREP BOWEL PREP KIT) 17.5-3.13-1.6 GM/177ML SOLN Mix and take by mouth  as directed on prep sheet 10/24/23     nitroGLYCERIN  (NITROSTAT ) 0.4 MG SL tablet Place 0.4 mg under the tongue every 5 (five) minutes as needed for chest pain.    [provider]  nitroGLYCERIN  (NITROSTAT ) 0.4 MG SL tablet Dissolve 1 tablet under tongue every 5 minutes up to 3 doses for chest pain, if no relief, call 911. 12/02/21     nystatin  (MYCOSTATIN ) 100000 UNIT/ML suspension Swish and swallow 10 ml four times daily 02/16/23     ofloxacin  (OCUFLOX ) 0.3 % ophthalmic solution Place 1 drop into the right eye 4 (four) times daily for 1 week 12/15/21     omeprazole (PRILOSEC) 40 MG capsule Take 40 mg by mouth daily.    [provider]  prednisoLONE  acetate (PRED FORTE ) 1 % ophthalmic suspension Instill 1 drop into left eye 4 times a day for 1 week, then 2  times a day for 1 week, then daily for 2 weeks then STOP 11/28/21     prednisoLONE  acetate (PRED FORTE ) 1 % ophthalmic suspension Place 1 drop into the right eye 4 (four) times daily for 1 week, then 2 times a day for 1 week, then daily for 2 weeks then STOP 12/15/21     predniSONE  (STERAPRED UNI-PAK 21 TAB) 5 MG (21) TBPK tablet Follow instructions on package 01/29/23     quinapril  (ACCUPRIL ) 5 MG tablet Take 1 tablet by mouth daily 08/09/20     ranitidine (ZANTAC) 150 MG tablet Take 150 mg by mouth 2 (two) times daily.    [provider]  sertraline  (ZOLOFT ) 50 MG tablet Take 1 tablet by mouth daily 10/12/20     sertraline  (ZOLOFT ) 50 MG tablet Take 1 tablet (50 mg total) by mouth daily. 11/02/21     sertraline  (ZOLOFT ) 50 MG tablet Take 1 tablet (50 mg total) by mouth daily. 06/04/23     sertraline  (ZOLOFT ) 50 MG tablet Take 1 tablet (50 mg total) by mouth daily. 09/10/23     sertraline  (ZOLOFT ) 50 MG tablet Take 1 tablet (50 mg total) by mouth daily. 11/09/23     spironolactone  (ALDACTONE ) 25 MG tablet Take 1/2 tablet by mouth daily. 12/21/20     spironolactone  (ALDACTONE ) 25 MG tablet Take 0.5 tablets (12.5 mg total) by  mouth daily. 12/14/22     spironolactone  (ALDACTONE ) 25 MG tablet Take 0.5 tablets (12.5 mg total) by mouth daily. 07/09/23     sucralfate  (CARAFATE ) 1 g tablet Dissolve 1 tablet in 1 oz of water & take by mouth 3 times a day before meals 06/03/21     sucralfate  (CARAFATE ) 1 g tablet Take 1 tablet (1 g total) dissolved in 1 ounce of water by mouth 3 (three) times daily before meals. 03/18/22     sucralfate  (CARAFATE ) 1 GM/10ML suspension Take 1 g by mouth 4 (four) times daily -  with meals and at bedtime.    [provider]  Vitamin D , Ergocalciferol , (DRISDOL ) 1.25 MG (50000 UNIT) CAPS capsule Take 1 capsule (50,000 Units total)  by mouth once a week. 08/15/23     Vitamin D , Ergocalciferol , (DRISDOL ) 1.25 MG (50000 UNIT) CAPS capsule Take 1 capsule (50,000 Units total) by mouth once a week. 11/09/23       Allergies: Bee venom and Ketorolac     Review of Systems  Updated Vital Signs BP 123/82 (BP Location: Right Arm)   Pulse 84   Temp 98.8 F (37.1 C) (Oral)   Resp 18   Ht 5' 8 (1.727 m)   Wt 72 kg   SpO2 95%   BMI 24.14 kg/m   Physical Exam Vitals and nursing note reviewed.  Constitutional:      Appearance: He is well-developed.  HENT:     Head: Normocephalic and atraumatic.  Eyes:     Pupils: Pupils are equal, round, and reactive to light.  Neck:     Vascular: No JVD.  Cardiovascular:     Rate and Rhythm: Normal rate and regular rhythm.     Heart sounds: No murmur heard.    No friction rub. No gallop.  Pulmonary:     Effort: No respiratory distress.     Breath sounds: No wheezing.  Abdominal:     General: There is no distension.     Tenderness: There is no abdominal tenderness. There is no guarding or rebound.  Musculoskeletal:        General: Normal range of motion.     Cervical back: Normal range of motion and neck supple.  Skin:    Coloration: Skin is not pale.     Findings: No rash.  Neurological:     Mental Status: He is alert and oriented to person, place,  and time.  Psychiatric:        Behavior: Behavior normal.     (all labs ordered are listed, but only abnormal results are displayed) Labs Reviewed  COMPREHENSIVE METABOLIC PANEL WITH GFR - Abnormal; Notable for the following components:      Result Value   Glucose, Bld 160 (*)    Calcium  8.8 (*)    All other components within normal limits  I-STAT CHEM 8, ED - Abnormal; Notable for the following components:   Glucose, Bld 158 (*)    Calcium , Ion 1.00 (*)    All other components within normal limits  PROTIME-INR  APTT  CBC  DIFFERENTIAL  ETHANOL  RAPID URINE DRUG SCREEN, HOSP PERFORMED    EKG: EKG Interpretation Date/Time:  Thursday December 06 2023 22:20:41 EST Ventricular Rate:  79 PR Interval:  187 QRS Duration:  88 QT Interval:  375 QTC Calculation: 430 R Axis:   -9  Text Interpretation: Sinus rhythm Anterior infarct, old No significant change since last tracing Confirmed by Emil Share [phone]) on 12/06/2023 10:23:36 PM  Radiology: CT HEAD CODE STROKE WO CONTRAST (LKW 0-4.5h, LVO 0-24h) Result Date: 12/06/2023 EXAM: CT HEAD WITHOUT CONTRAST 12/06/2023 09:21:11 PM TECHNIQUE: CT of the head was performed without the administration of intravenous contrast. Automated exposure control, iterative reconstruction, and/or weight based adjustment of the mA/kV was utilized to reduce the radiation dose to as low as reasonably achievable. COMPARISON: None available. CLINICAL HISTORY: Neuro deficit, acute, stroke suspected. FINDINGS: BRAIN AND VENTRICLES: No acute hemorrhage. No evidence of acute infarct. No hydrocephalus. No extra-axial collection. No mass effect or midline shift. [place] Stroke Program Early CT Score (ASPECTS): Ganglionic (caudate, internal capsule, lentiform nucleus, insula, M1-M3): 7. Supraganglionic (M4-M6): 3. Total: 10. ORBITS: No acute abnormality. SINUSES: No acute abnormality. SOFT TISSUES AND SKULL: No acute  soft tissue abnormality. No skull fracture. IMPRESSION:  1. No acute intracranial abnormality. 2. ASPECTS score is 10. 3. Findings were communicated to Dr. Camellia Shark at 09:25 pm on 12/06/2023. Electronically signed by: Franky Stanford MD 12/06/2023 09:26 PM EST RP Workstation: [id number]     Procedures   Medications Ordered in the ED  tenecteplase [facility]) injection for stroke 18 mg (18 mg Intravenous Given 12/06/23 2206)                                    Medical Decision Making Risk Prescription drug management.   68 yo M with a chief complaints of right leg weakness.  Started abruptly about an hour ago.  Patient was made a code stroke.  Seen by neurology.  Given TNK.  Neuro admit.   The patients results and plan were reviewed and discussed.   Any x-rays performed were independently reviewed by myself.   Differential diagnosis were considered with the presenting HPI.  Medications  tenecteplase (TNKASE) injection for stroke 18 mg (18 mg Intravenous Given 12/06/23 2206)    Vitals:   12/06/23 2003 12/06/23 2106 12/06/23 2109  BP: 123/82    Pulse: 84    Resp: 18    Temp:   98.8 F (37.1 C)  TempSrc:   Oral  SpO2: 95%    Weight:  72 kg   Height:  5' 8 (1.727 m)     Final diagnoses:  Acute ischemic stroke [facility])    Admission/ observation were discussed with the admitting physician, patient and/or family and they are comfortable with the plan.       Final diagnoses:  Acute ischemic stroke [facility])    ED Discharge Orders     None          Emil Share, DO 12/06/23 2224

## 2023-12-06 NOTE — ED Provider Triage Note (Signed)
 Emergency Medicine Provider Triage Evaluation Note  Anthony Newman , a 68 y.o. male  was evaluated in triage.  Pt complains of right lower leg weakness.  Around 1 hour prior to arrival, the patient reports that he was sitting up watching television when he all of a sudden fell over to the side.  He did not pass out or lose any consciousness.  He reports he had just fallen over while sitting up in bed.  He reports when he was trying to get up and stand he felt weakness in his right lower extremity.  This concerned him.  He denies any headache.  He reports that he was having some intermittent chest pain earlier in the day but took a nitro and aspirin and this is resolved.  Given the weakness he presented to the emergency department.  Review of Systems  Positive:  Negative:   Physical Exam  BP 123/82 (BP Location: Right Arm)   Pulse 84   Resp 18   Ht 5' 8 (1.727 m)   Wt 72 kg   SpO2 95%   BMI 24.14 kg/m  Gen:   Awake, no distress   Resp:  Normal effort  MSK:   Moves extremities without difficulty  Other:  Patient has right upper extremity and right lower extremity weakness.  Reports sensations intact and symmetric throughout however.  Cranial nerves grossly intact as well.  Medical Decision Making  Medically screening exam initiated at 9:09 PM.  Appropriate orders placed.  Anthony Newman was informed that the remainder of the evaluation will be completed by another provider, this initial triage assessment does not replace that evaluation, and the importance of remaining in the ED until their evaluation is complete.  Code stroke activated.  CT called, room 1 available.   Anthony Newman, [place] 12/06/23 2112

## 2023-12-06 NOTE — Progress Notes (Signed)
 PHARMACIST CODE STROKE RESPONSE  Notified to mix TNK at 2204 by Dr. Merrianne TNK preparation completed at 2205  TNK dose = 18 mg IV over 5 seconds.   Issues/delays encountered (if applicable): Patient decision on treatment  Anthony Newman 12/06/23 10:14 PM

## 2023-12-06 NOTE — ED Triage Notes (Signed)
 Pt c/o right leg numbness that started 45 minutes pta. Also endorses shob, states that he took an asa to help with what he was going through.

## 2023-12-06 NOTE — Code Documentation (Signed)
 Stroke Response Nurse Documentation Code Documentation  Anthony Newman is a 68 y.o. male arriving to [facility]  via Private Vehicle on 12-06-23 with past medical hx of MI. On aspirin 81 mg daily. Code stroke was activated by ED.   Patient from home where he was LKW at 1900 and now complaining of Left leg weakness.  He said he sat down in bed to watch TV about 7pm and when he stood up he couldn't move his left leg  Stroke team at the bedside on patient arrival. Labs drawn and patient cleared for CT by EDP. Patient to CT with team. NIHSS 2, see documentation for details and code stroke times. Patient with left leg weakness and left decreased sensation on exam. The following imaging was completed:  CT Head. Patient is a candidate for IV Thrombolytic.  TNK given at 2206 Patient is not a candidate for IR due to no LVO suspected.   Care Plan: VS and NIHSS q 15 min x 2 hours then q x 6 then q 1 hour x 16 then q 4hours. Admission to ICU  Bedside handoff with ED RN Delon Faith, [facility]  Stroke Response RN

## 2023-12-06 NOTE — Consult Note (Deleted)
 NEUROLOGY CONSULT NOTE   Date of service: December 06, 2023 Patient Name: Anthony Newman MRN:  [id number] DOB:  Oct 20, 1955 Chief Complaint: My right leg became weak Requesting Provider: Emil Share, DO  History of Present Illness  Anthony Newman is a 68 y.o. male with a PMHx of CAD and smoking who presents to the ED with acute onset of RLE numbness and weakness, from the hip to his toes. He was lying down in bed awake with LKN of 7:00 PM (the time he got into bed). Subsequently, he got up and could not stand normally due to RLE weakness and numbness. He fell over to his LEFT and was able to get back up with some difficulty. At the time of symptom onset, he had some CP that improved with ASA and nitroglycerin , but which did not help with the leg weakness. He had no right arm weakness or numbness, no facial droop, no vision change, no aphasia and no ataxia. His left side was asymptomatic. BP 123/82.  Home medications include Plavix  and atorvastatin .   LKW: 7:00 PM Modified rankin score: 0 IV Thrombolysis: Yes EVT: No: Presentation is not consistent with LVO  NIHSS components Score: Comment  1a Level of Conscious 0[x]  1[]  2[]  3[]      1b LOC Questions 0[x]  1[]  2[]       1c LOC Commands 0[x]  1[]  2[]       2 Best Gaze 0[x]  1[]  2[]       3 Visual 0[x]  1[]  2[]  3[]      4 Facial Palsy 0[x]  1[]  2[]  3[]      5a Motor Arm - left 0[x]  1[]  2[]  3[]  4[]  UN[]    5b Motor Arm - Right 0[x]  1[]  2[]  3[]  4[]  UN[]    6a Motor Leg - Left 0[x]  1[]  2[]  3[]  4[]  UN[]    6b Motor Leg - Right 0[]  1[x]  2[]  3[]  4[]  UN[]    7 Limb Ataxia 0[x]  1[]  2[]  UN[]      8 Sensory 0[]  1[x]  2[]  UN[]     RLE  9 Best Language 0[x]  1[]  2[]  3[]      10 Dysarthria 0[x]  1[]  2[]  UN[]      11 Extinct. and Inattention 0[x]  1[]  2[]       TOTAL:  2      ROS  Had CP at time of symptom onset that resolved after nitroglycerin . Also had some SOB. Comprehensive ROS performed and other pertinent positives are as documented in HPI    Past History    Past Medical History:  Diagnosis Date   Coronary artery disease     Past Surgical History:  Procedure Laterality Date   LAPAROSCOPIC APPENDECTOMY N/A 06/05/2019   Procedure: APPENDECTOMY LAPAROSCOPIC;  Surgeon: Kinsinger, Herlene Righter, MD;  Location: [facility] OR;  Service: General;  Laterality: N/A;    Family History: History reviewed. No pertinent family history.  Social History  reports that he has been smoking. He does not have any smokeless tobacco history on file. He reports that he does not drink alcohol and does not use drugs.  Allergies  Allergen Reactions   Bee Venom Anaphylaxis   Ketorolac  Nausea And Vomiting    Medications  No current facility-administered medications for this encounter.  Current Outpatient Medications:    acetaminophen -codeine  (TYLENOL  #3) 300-30 MG tablet, Take 1 tablet by mouth every 6 (six) hours as needed for dental pain, Disp: 10 tablet, Rfl: 0   albuterol  (VENTOLIN  HFA) 108 (90 Base) MCG/ACT inhaler, INHALE 2 PUFFS INTO THE LUNGS EVERY 6 HOURS AS NEEDED, Disp:  18 g, Rfl: 3   albuterol  (VENTOLIN  HFA) 108 (90 Base) MCG/ACT inhaler, Inhale 2 puffs by mouth every 6 hours as needed, Disp: 18 g, Rfl: 6   albuterol  (VENTOLIN  HFA) 108 (90 Base) MCG/ACT inhaler, Inhale 2 puffs into the lungs every 6 (six) hours as needed., Disp: 6.7 g, Rfl: 6   albuterol  (VENTOLIN  HFA) 108 (90 Base) MCG/ACT inhaler, Inhale 2 puffs into the lungs every 6 (six) hours as needed., Disp: 6.7 g, Rfl: 6   albuterol  (VENTOLIN  HFA) 108 (90 Base) MCG/ACT inhaler, Inhale 2 puffs into the lungs every 6 (six) hours as needed for wheezing, Disp: 6.7 g, Rfl: 6   albuterol  (VENTOLIN  HFA) 108 (90 Base) MCG/ACT inhaler, Inhale 2 puffs into the lungs every 6 (six) hours as needed., Disp: 6.7 g, Rfl: 6   amoxicillin  (AMOXIL ) 500 MG capsule, Take 1 capsule (500 mg total) by mouth every 8 (eight) hours until gone., Disp: 30 capsule, Rfl: 0   amoxicillin  (AMOXIL ) 500 MG capsule, Take 1 capsule (500 mg  total) by mouth every 8 (eight) hours., Disp: 21 capsule, Rfl: 0   aspirin  EC 81 MG tablet, Take 81 mg by mouth daily. , Disp: , Rfl:    atorvastatin  (LIPITOR ) 80 MG tablet, Take 1 tablet by mouth daily, Disp: 30 tablet, Rfl: 5   atorvastatin  (LIPITOR ) 80 MG tablet, Take 1 tablet (80 mg total) by mouth daily., Disp: 30 tablet, Rfl: 5   atorvastatin  (LIPITOR ) 80 MG tablet, Take 1 tablet (80 mg total) by mouth daily., Disp: 90 tablet, Rfl: 1   atorvastatin  (LIPITOR ) 80 MG tablet, Take 1 tablet (80 mg total) by mouth daily., Disp: 90 tablet, Rfl: 1   atorvastatin  (LIPITOR ) 80 MG tablet, Take 1 tablet (80 mg total) by mouth daily., Disp: 90 tablet, Rfl: 1   atorvastatin  (LIPITOR ) 80 MG tablet, Take 1 tablet (80 mg total) by mouth daily., Disp: 90 tablet, Rfl: 1   atorvastatin  (LIPITOR ) 80 MG tablet, Take 1 tablet (80 mg total) by mouth daily., Disp: 90 tablet, Rfl: 1   azithromycin  (ZITHROMAX ) 250 MG tablet, Take 2 tablets by mouth on day 1 then take 1 tablet daily until finished, Disp: 6 tablet, Rfl: 0   baclofen  (LIORESAL ) 10 MG tablet, Take 1 tablet (10 mg total) by mouth every 8 (eight) hours as needed for muscle spasm, Disp: 60 tablet, Rfl: 3   baclofen  (LIORESAL ) 10 MG tablet, Take 1 tablet (10 mg total) by mouth every 8 (eight) hours as needed for muscle spasm, Disp: 60 tablet, Rfl: 3   baclofen  (LIORESAL ) 10 MG tablet, Take 1 tablet (10 mg total) by mouth every 8 (eight) hours as needed for muscle spasms, Disp: 60 tablet, Rfl: 3   baclofen  (LIORESAL ) 10 MG tablet, Take 1 tablet (10 mg total) by mouth every 8 (eight) hours as needed for muscle spasm., Disp: 60 tablet, Rfl: 3   carvedilol  (COREG ) 6.25 MG tablet, Take 1 tablet by mouth 2 times a day, Disp: 60 tablet, Rfl: 2   carvedilol  (COREG ) 6.25 MG tablet, Take 1 tablet by mouth two times daily for hypertension, Disp: 60 tablet, Rfl: 5   carvedilol  (COREG ) 6.25 MG tablet, Take 1 tablet by mouth two times daily, Disp: 60 tablet, Rfl: 5    carvedilol  (COREG ) 6.25 MG tablet, Take 1 tablet (6.25 mg total) by mouth 2 (two) times daily for hypertension., Disp: 60 tablet, Rfl: 5   carvedilol  (COREG ) 6.25 MG tablet, Take 1 tablet (6.25 mg total) by mouth  2 (two) times daily., Disp: 180 tablet, Rfl: 1   carvedilol  (COREG ) 6.25 MG tablet, Take 1 tablet (6.25 mg total) by mouth 2 (two) times daily., Disp: 180 tablet, Rfl: 1   cefdinir  (OMNICEF ) 300 MG capsule, Take 1 capsule (300 mg total) by mouth 2 (two) times daily., Disp: 14 capsule, Rfl: 0   cefdinir  (OMNICEF ) 300 MG capsule, Take 1 capsule (300 mg total) by mouth 2 (two) times daily., Disp: 10 capsule, Rfl: 0   chlorhexidine  (PERIDEX ) 0.12 % solution, Swish for one minute and expectorate twice daily for 10 days., Disp: 473 mL, Rfl: 0   clopidogrel  (PLAVIX ) 75 MG tablet, Take 1 tablet by mouth daily., Disp: 30 tablet, Rfl: 5   clopidogrel  (PLAVIX ) 75 MG tablet, Take 1 tablet (75 mg total) by mouth daily., Disp: 30 tablet, Rfl: 5   clopidogrel  (PLAVIX ) 75 MG tablet, Take 1 tablet (75 mg total) by mouth daily., Disp: 90 tablet, Rfl: 1   dicyclomine  (BENTYL ) 10 MG capsule, Take 10 mg by mouth 3 (three) times daily as needed for spasms., Disp: , Rfl:    dicyclomine  (BENTYL ) 10 MG capsule, TAKE 1 CAPSULE BY MOUTH THREE TIMES DAILY BEFORE MEALS AND A BEDTIME FOR PAIN AND CRAMPING, Disp: 120 capsule, Rfl: 5   dicyclomine  (BENTYL ) 10 MG capsule, Take 1 capsule by mouth 3 times daily before meals and at bedtime for pain and cramping., Disp: 120 capsule, Rfl: 5   dicyclomine  (BENTYL ) 10 MG capsule, Take 1 capsule (10 mg total) by mouth 3 (three) times daily before meals and at bedtime., Disp: 120 capsule, Rfl: 5   dicyclomine  (BENTYL ) 10 MG capsule, Take 1 capsule (10 mg total) by mouth 3 (three) times daily - before meals and at bedtime., Disp: 120 capsule, Rfl: 5   dicyclomine  (BENTYL ) 10 MG capsule, Take 1 capsule (10 mg total) by mouth 4 (four) times daily -  before meals and at bedtime., Disp:  120 capsule, Rfl: 5   doxycycline  (VIBRAMYCIN ) 100 MG capsule, Take 1 capsule (100 mg total) by mouth 2 (two) times daily., Disp: 14 capsule, Rfl: 0   doxycycline  (VIBRAMYCIN ) 50 MG capsule, Take 1 capsule (50 mg total) by mouth 2 (two) times daily for antibiotic, Disp: 14 capsule, Rfl: 0   EPINEPHrine  0.3 mg/0.3 mL IJ SOAJ injection, Inject as needed for severe reaction, repeat in 3 minutes if not improving, Disp: 2 each, Rfl: 2   famotidine  (PEPCID ) 20 MG tablet, Take 1 tablet (20 mg total) by mouth 2 (two) times daily., Disp: 180 tablet, Rfl: 0   gabapentin  (NEURONTIN ) 300 MG capsule, Take 1 capsule (300 mg total) by mouth 3 (three) times daily., Disp: 90 capsule, Rfl: 2   gabapentin  (NEURONTIN ) 300 MG capsule, Take 1 capsule by mouth three times daily for chest wall/neck and back pain., Disp: 90 capsule, Rfl: 2   gabapentin  (NEURONTIN ) 300 MG capsule, Take 1 capsule (300 mg total) by mouth 3 (three) times daily as needed for chronic back and neck pain., Disp: 90 capsule, Rfl: 2   gabapentin  (NEURONTIN ) 300 MG capsule, Take 1 capsule (300 mg total) by mouth 3 (three) times daily., Disp: 90 capsule, Rfl: 2   gabapentin  (NEURONTIN ) 300 MG capsule, Take 1 capsule (300 mg total) by mouth 3 (three) times daily for chronic back and neck pain., Disp: 90 capsule, Rfl: 2   HYDROcodone  bit-homatropine (HYDROMET) 5-1.5 MG/5ML syrup, Take 5 mL by mouth every 4 to 6 hours as needed for cough, Disp: 200 mL, Rfl:  0   HYDROcodone -acetaminophen  (NORCO) 10-325 MG per tablet, Take 0.5-1 tablets by mouth every 6 (six) hours as needed., Disp: , Rfl:    HYDROcodone -acetaminophen  (NORCO) 10-325 MG tablet, Take 1 (one) Tablet by mouth two times daily, max daily dose: 2 Tablets, Disp: 60 tablet, Rfl: 0   HYDROcodone -acetaminophen  (NORCO) 10-325 MG tablet, Take 1 (one) Tablet by mouth two times daily, max daily dose: 2 Tablets, Disp: 60 tablet, Rfl: 0   HYDROcodone -acetaminophen  (NORCO) 10-325 MG tablet, Take 1 (one) Tablet  by mouth two times daily, max daily dose: 2 Tablet, Disp: 60 tablet, Rfl: 0   HYDROcodone -acetaminophen  (NORCO) 10-325 MG tablet, Take 1 tablet by mouth two times daily (max 2 per day), Disp: 60 tablet, Rfl: 0   HYDROcodone -acetaminophen  (NORCO) 10-325 MG tablet, Take 1 tablet by mouth 2 times daily, max daily dose: 2 tablets, Disp: 60 tablet, Rfl: 0   HYDROcodone -acetaminophen  (NORCO) 10-325 MG tablet, Take 1 tablet by mouth 2 (two) times daily., Disp: 60 tablet, Rfl: 0   ibuprofen  (ADVIL ) 800 MG tablet, Take 1 tablet (800 mg total) by mouth 3 (three) times daily., Disp: 21 tablet, Rfl: 0   ibuprofen  (ADVIL ) 800 MG tablet, Take 1 tablet every 8 hours as needed for pain, Disp: 20 tablet, Rfl: 1   GI Cocktail (alum & mag hydroxide-simethicone /lidocaine )oral mixture, Take 15 mL (1 tablespoonful) every 6 hours as needed for acid reflux pain., Disp: 360 mL, Rfl: 5   lidocaine  in alum & mag hydroxide-simeth suspension, Take 15 ml (1 tablespoonful) by mouth as directed every 3 hours as needed for severe heartburn. **Shake well before each use.**, Disp: 240 mL, Rfl: 12   lidocaine -alum & mag hydroxide-simeth, Take 1-2 tablespoonfuls by mouth every 3 hours as needed for esophageal spasms/pain, Disp: 480 mL, Rfl: 6   lidocaine -alum & mag hydroxide-simeth, Take 30 mls by mouth every 4 hours as needed, Disp: 480 mL, Rfl: PRN   lidocaine -alum & mag hydroxide-simeth, Use as directed 15-30 mLs in the mouth or throat every 3 (three) hours as needed for servere heartburn. (shake well before using), Disp: 480 mL, Rfl: 99   losartan  (COZAAR ) 25 MG tablet, Take 1 tablet by mouth daily. Discontinue quinapril , Disp: 90 tablet, Rfl: 1   losartan  (COZAAR ) 25 MG tablet, Take 1 tablet (25 mg total) by mouth daily. Discontinue quinapril , Disp: 90 tablet, Rfl: 1   losartan  (COZAAR ) 25 MG tablet, Take 1 tablet (25 mg total) by mouth daily. Discontinue quinapril , Disp: 90 tablet, Rfl: 1   losartan  (COZAAR ) 25 MG tablet, Take 1  tablet (25 mg total) by mouth daily., Disp: 90 tablet, Rfl: 1   methocarbamol  (ROBAXIN ) 500 MG tablet, Take 1 tablet (500 mg total) by mouth 3 (three) times daily., Disp: 90 tablet, Rfl: 0   montelukast  (SINGULAIR ) 10 MG tablet, Take 1 tablet (10 mg total) by mouth daily for chronic allergies., Disp: 30 tablet, Rfl: 3   montelukast  (SINGULAIR ) 10 MG tablet, Take 1 tablet by mouth daily for chronic allergies, Disp: 30 tablet, Rfl: 3   montelukast  (SINGULAIR ) 10 MG tablet, Take 1 tablet by mouth daily for chronic allergies, Disp: 30 tablet, Rfl: 5   montelukast  (SINGULAIR ) 10 MG tablet, Take 1 tablet (10 mg total) by mouth daily for chronic allergies, Disp: 30 tablet, Rfl: 5   montelukast  (SINGULAIR ) 10 MG tablet, Take 1 tablet (10 mg total) by mouth daily for chronic allergies., Disp: 30 tablet, Rfl: 5   montelukast  (SINGULAIR ) 10 MG tablet, Take 1 tablet (10 mg  total) by mouth daily for chronic allergies, Disp: 30 tablet, Rfl: 5   montelukast  (SINGULAIR ) 10 MG tablet, Take 1 tablet (10 mg total) by mouth daily for chronic allergies., Disp: 90 tablet, Rfl: 1   Na Sulfate-K Sulfate-Mg Sulf (SUPREP BOWEL PREP KIT) 17.5-3.13-1.6 GM/177ML SOLN, Use as directed per prep sheet, Disp: 354 mL, Rfl: 0   Na Sulfate-K Sulfate-Mg Sulfate concentrate (SUPREP BOWEL PREP KIT) 17.5-3.13-1.6 GM/177ML SOLN, Mix and take by mouth as directed on prep sheet, Disp: 354 mL, Rfl: 0   nitroGLYCERIN  (NITROSTAT ) 0.4 MG SL tablet, Place 0.4 mg under the tongue every 5 (five) minutes as needed for chest pain., Disp: , Rfl:    nitroGLYCERIN  (NITROSTAT ) 0.4 MG SL tablet, Dissolve 1 tablet under tongue every 5 minutes up to 3 doses for chest pain, if no relief, call 911., Disp: 25 tablet, Rfl: 1   nystatin  (MYCOSTATIN ) 100000 UNIT/ML suspension, Swish and swallow 10 ml four times daily, Disp: 480 mL, Rfl: 0   ofloxacin  (OCUFLOX ) 0.3 % ophthalmic solution, Place 1 drop into the right eye 4 (four) times daily for 1 week, Disp: 5 mL,  Rfl: 1   omeprazole (PRILOSEC) 40 MG capsule, Take 40 mg by mouth daily., Disp: , Rfl:    prednisoLONE  acetate (PRED FORTE ) 1 % ophthalmic suspension, Instill 1 drop into left eye 4 times a day for 1 week, then 2  times a day for 1 week, then daily for 2 weeks then STOP, Disp: 5 mL, Rfl: 5   prednisoLONE  acetate (PRED FORTE ) 1 % ophthalmic suspension, Place 1 drop into the right eye 4 (four) times daily for 1 week, then 2 times a day for 1 week, then daily for 2 weeks then STOP, Disp: 5 mL, Rfl: 2   predniSONE  (STERAPRED UNI-PAK 21 TAB) 5 MG (21) TBPK tablet, Follow instructions on package, Disp: 21 tablet, Rfl: 0   quinapril  (ACCUPRIL ) 5 MG tablet, Take 1 tablet by mouth daily, Disp: 30 tablet, Rfl: 3   ranitidine (ZANTAC) 150 MG tablet, Take 150 mg by mouth 2 (two) times daily., Disp: , Rfl:    sertraline  (ZOLOFT ) 50 MG tablet, Take 1 tablet by mouth daily, Disp: 90 tablet, Rfl: 1   sertraline  (ZOLOFT ) 50 MG tablet, Take 1 tablet (50 mg total) by mouth daily., Disp: 90 tablet, Rfl: 1   sertraline  (ZOLOFT ) 50 MG tablet, Take 1 tablet (50 mg total) by mouth daily., Disp: 90 tablet, Rfl: 1   sertraline  (ZOLOFT ) 50 MG tablet, Take 1 tablet (50 mg total) by mouth daily., Disp: 90 tablet, Rfl: 1   sertraline  (ZOLOFT ) 50 MG tablet, Take 1 tablet (50 mg total) by mouth daily., Disp: 90 tablet, Rfl: 1   spironolactone  (ALDACTONE ) 25 MG tablet, Take 1/2 tablet by mouth daily., Disp: 15 tablet, Rfl: 2   spironolactone  (ALDACTONE ) 25 MG tablet, Take 0.5 tablets (12.5 mg total) by mouth daily., Disp: 45 tablet, Rfl: 1   spironolactone  (ALDACTONE ) 25 MG tablet, Take 0.5 tablets (12.5 mg total) by mouth daily., Disp: 45 tablet, Rfl: 1   sucralfate  (CARAFATE ) 1 g tablet, Dissolve 1 tablet in 1 oz of water & take by mouth 3 times a day before meals, Disp: 120 tablet, Rfl: 2   sucralfate  (CARAFATE ) 1 g tablet, Take 1 tablet (1 g total) dissolved in 1 ounce of water by mouth 3 (three) times daily before meals., Disp:  270 tablet, Rfl: 1   sucralfate  (CARAFATE ) 1 GM/10ML suspension, Take 1 g by mouth 4 (four)  times daily -  with meals and at bedtime., Disp: , Rfl:    Vitamin D , Ergocalciferol , (DRISDOL ) 1.25 MG (50000 UNIT) CAPS capsule, Take 1 capsule (50,000 Units total) by mouth once a week., Disp: 5 capsule, Rfl: 2   Vitamin D , Ergocalciferol , (DRISDOL ) 1.25 MG (50000 UNIT) CAPS capsule, Take 1 capsule (50,000 Units total) by mouth once a week., Disp: 5 capsule, Rfl: 2  Vitals   Vitals:   12/06/23 2003 12/06/23 2106 12/06/23 2109  BP: 123/82    Pulse: 84    Resp: 18    Temp:   98.8 F (37.1 C)  TempSrc:   Oral  SpO2: 95%    Weight:  72 kg   Height:  5' 8 (1.727 m)     Body mass index is 24.14 kg/m.   Physical Exam   Constitutional: Appears well-developed and well-nourished.  Psych: Affect appropriate to situation.  Eyes: No scleral injection.  HENT: No OP obstruction.  Head: Normocephalic.  Respiratory: Effort normal, non-labored breathing.  Skin: WDI.   Neurologic Examination   Reflexes are 1+ and symmetric throughout with downgoing toes. See NIHSS for other exam details.   Labs/Imaging/Neurodiagnostic studies   CBC: No results for input(s): WBC, NEUTROABS, HGB, HCT, MCV, PLT in the last 168 hours. Basic Metabolic Panel:  Lab Results  Component Value Date   NA 139 08/03/2022   K 3.7 08/03/2022   CO2 21 (L) 08/03/2022   GLUCOSE 115 (H) 08/03/2022   BUN 12 08/03/2022   CREATININE 1.13 08/03/2022   CALCIUM  9.4 08/03/2022   GFRNONAA >60 08/03/2022   GFRAA >60 06/05/2019   Lipid Panel: No results found for: LDLCALC HgbA1c: No results found for: HGBA1C Urine Drug Screen: No results found for: LABOPIA, COCAINSCRNUR, LABBENZ, AMPHETMU, THCU, LABBARB  Alcohol Level No results found for: [facility] INR  Lab Results  Component Value Date   INR 1.00 03/11/2010   APTT  Lab Results  Component Value Date   APTT 35 03/11/2010     ASSESSMENT  68 y.o.  male with a PMHx of CAD and smoking who presents to the ED with acute onset of RLE numbness and weakness, from the hip to his toes. He was lying down in bed awake with LKN of 7:00 PM (the time he got into bed). Subsequently, he got up and could not stand normally due to RLE weakness and numbness. He fell over to his LEFT and was able to get back up with some difficulty. At the time of symptom onset, he had some CP that improved with ASA and nitroglycerin , but which did not help with the leg weakness. He had no right arm weakness or numbness, no facial droop, no vision change, no aphasia and no ataxia. His left side was asymptomatic. Home medications include Plavix  and atorvastatin .  - Exam reveals RLE weakness and numbness. NIHSS 2.  - CT head: No acute intracranial abnormality. ASPECTS score is 10. - EKG: Sinus rhythm Anterior infarct, old - Labs: - UDS positive for opiates (on opiate pain medication at home) - EtOH negative.  - Glucose 160 - Coags normal - CBC normal Na, K, BUN, Cr are normal - Ionized calcium  is low at 1.00. Total serum calcium  borderline low at 8.8 with normal albumin. LFTs normal.  - Impression: - Acute onset of RLE weakness and numbness. DDx includes acute left ACA territory stroke. Although he has LBP, the numbness and weakness involves the entire leg, which militates against a radiculopathy.  - After comprehensive  review of possible contraindications, he has no absolute contraindications to TNK administration. - Patient is a TNK candidate. Discussed extensively the risks/benefits of TNK treatment vs. no treatment with the patient, including risks of hemorrhage and death with IV thrombolytic administration versus worse overall outcomes on average in patients within the thrombolysis time window who are not administered TNK. Overall benefits of TNK regarding long-term prognosis are felt to outweigh risks. The patient expressed understanding and wish to proceed with TNK. Some  delay in administration of TNK due to patient wishing to take some time to discuss with his relative by telephone prior to consenting.   RECOMMENDATIONS  1. Admitting to Neuro ICU.  2. Post-TNK order set to include frequent neuro checks and BP management.  3. No antiplatelet medications or anticoagulants for at least 24 hours following TNK.  4. DVT prophylaxis with SCDs.  5. Continue atorvastatin .  6. Will need escalation of antiplatelet therapy to DAPT if follow up CT at 24 hours is negative for hemorrhagic conversion. 7. CTA of head and neck 8. TTE.  9. MRI brain  10. PT/OT/Speech.  11. NPO until passes swallow evaluation.  12. Telemetry monitoring 13. Fasting lipid panel, HgbA1c  ______________________________________________________________________    Bonney SHARK, Lorain Fettes, MD [facility]

## 2023-12-07 ENCOUNTER — Telehealth (HOSPITAL_COMMUNITY): Payer: Self-pay

## 2023-12-07 ENCOUNTER — Other Ambulatory Visit (HOSPITAL_COMMUNITY): Payer: Self-pay

## 2023-12-07 ENCOUNTER — Inpatient Hospital Stay (HOSPITAL_COMMUNITY)

## 2023-12-07 DIAGNOSIS — I639 Cerebral infarction, unspecified: Principal | ICD-10-CM | POA: Diagnosis present

## 2023-12-07 LAB — ECHOCARDIOGRAM LIMITED
Height: 68 in
Weight: 2539.7 [oz_av]

## 2023-12-07 LAB — ECHOCARDIOGRAM COMPLETE
Area-P 1/2: 3.53 cm2
Calc EF: 48 %
Height: 68 in
S' Lateral: 2.7 cm
Single Plane A2C EF: 47.4 %
Single Plane A4C EF: 49.2 %
Weight: 2539.7 [oz_av]

## 2023-12-07 LAB — LIPID PANEL
Cholesterol: 156 mg/dL (ref 0–200)
HDL: 40 mg/dL — ABNORMAL LOW (ref >40)
LDL Cholesterol: 103 mg/dL — ABNORMAL HIGH (ref 0–99)
Total CHOL/HDL Ratio: 3.9 ratio
Triglycerides: 64 mg/dL (ref <150)
VLDL: 13 mg/dL (ref 0–40)

## 2023-12-07 LAB — RAPID URINE DRUG SCREEN, HOSP PERFORMED
Amphetamines: NOT DETECTED
Barbiturates: NOT DETECTED
Benzodiazepines: NOT DETECTED
Cocaine: NOT DETECTED
Opiates: POSITIVE — AB
Tetrahydrocannabinol: NOT DETECTED

## 2023-12-07 LAB — HEMOGLOBIN A1C
Hgb A1c MFr Bld: 6 % — ABNORMAL HIGH (ref 4.8–5.6)
Mean Plasma Glucose: 125.5 mg/dL

## 2023-12-07 LAB — HIV ANTIBODY (ROUTINE TESTING W REFLEX): HIV Screen 4th Generation wRfx: NONREACTIVE

## 2023-12-07 MED ORDER — IOHEXOL 350 MG/ML SOLN
80.0000 mL | Freq: Once | INTRAVENOUS | Status: AC | PRN
  Administered 2023-12-07: 80 mL via INTRAVENOUS

## 2023-12-07 MED ORDER — ACETAMINOPHEN 160 MG/5ML PO SOLN: 650.0000 mg | ORAL | Status: DC | PRN

## 2023-12-07 MED ORDER — ALBUTEROL SULFATE (2.5 MG/3ML) 0.083% IN NEBU: 2.5000 mg | INHALATION_SOLUTION | Freq: Four times a day (QID) | RESPIRATORY_TRACT | Status: DC | PRN

## 2023-12-07 MED ORDER — GABAPENTIN 300 MG PO CAPS
300.0000 mg | ORAL_CAPSULE | Freq: Two times a day (BID) | ORAL | Status: DC
  Administered 2023-12-07 – 2023-12-08 (×4): 300 mg via ORAL
  Filled 2023-12-07 (×4): qty 1

## 2023-12-07 MED ORDER — ATORVASTATIN CALCIUM 80 MG PO TABS
80.0000 mg | ORAL_TABLET | Freq: Every day | ORAL | Status: DC
  Administered 2023-12-07 – 2023-12-08 (×2): 80 mg via ORAL
  Filled 2023-12-07 (×2): qty 1

## 2023-12-07 MED ORDER — ACETAMINOPHEN 325 MG PO TABS: 650.0000 mg | ORAL_TABLET | ORAL | Status: DC | PRN

## 2023-12-07 MED ORDER — STROKE: EARLY STAGES OF RECOVERY BOOK
Freq: Once | Status: AC
  Filled 2023-12-07: qty 1

## 2023-12-07 MED ORDER — NITROGLYCERIN 0.4 MG SL SUBL: 0.4000 mg | SUBLINGUAL_TABLET | SUBLINGUAL | Status: DC | PRN

## 2023-12-07 MED ORDER — HYDROCODONE-ACETAMINOPHEN 10-325 MG PO TABS
0.5000 | ORAL_TABLET | Freq: Two times a day (BID) | ORAL | Status: DC
  Administered 2023-12-07 – 2023-12-08 (×3): 1 via ORAL
  Filled 2023-12-07 (×3): qty 1

## 2023-12-07 MED ORDER — SODIUM CHLORIDE 0.9 % IV SOLN: INTRAVENOUS | Status: DC

## 2023-12-07 MED ORDER — TICAGRELOR 90 MG PO TABS
90.0000 mg | ORAL_TABLET | Freq: Two times a day (BID) | ORAL | Status: DC
  Filled 2023-12-07: qty 1

## 2023-12-07 MED ORDER — LOSARTAN POTASSIUM 50 MG PO TABS
25.0000 mg | ORAL_TABLET | Freq: Every day | ORAL | Status: DC
  Administered 2023-12-07 – 2023-12-08 (×2): 25 mg via ORAL
  Filled 2023-12-07 (×2): qty 1

## 2023-12-07 MED ORDER — NICOTINE 7 MG/24HR TD PT24
7.0000 mg | MEDICATED_PATCH | Freq: Every day | TRANSDERMAL | Status: DC
  Administered 2023-12-07 – 2023-12-08 (×2): 7 mg via TRANSDERMAL
  Filled 2023-12-07 (×2): qty 1

## 2023-12-07 MED ORDER — ENOXAPARIN SODIUM 40 MG/0.4ML IJ SOSY
40.0000 mg | PREFILLED_SYRINGE | INTRAMUSCULAR | Status: DC
  Filled 2023-12-07: qty 0.4

## 2023-12-07 MED ORDER — SENNOSIDES-DOCUSATE SODIUM 8.6-50 MG PO TABS: 1.0000 | ORAL_TABLET | Freq: Every evening | ORAL | Status: DC | PRN

## 2023-12-07 MED ORDER — CLEVIDIPINE BUTYRATE 0.5 MG/ML IV EMUL: 0.0000 mg/h | INTRAVENOUS | Status: DC

## 2023-12-07 MED ORDER — PANTOPRAZOLE SODIUM 40 MG IV SOLR
40.0000 mg | Freq: Every day | INTRAVENOUS | Status: DC
  Filled 2023-12-07: qty 10

## 2023-12-07 MED ORDER — SERTRALINE HCL 50 MG PO TABS
50.0000 mg | ORAL_TABLET | Freq: Every day | ORAL | Status: DC
  Administered 2023-12-07 – 2023-12-08 (×2): 50 mg via ORAL
  Filled 2023-12-07 (×2): qty 1

## 2023-12-07 MED ORDER — ORAL CARE MOUTH RINSE: 15.0000 mL | OROMUCOSAL | Status: DC | PRN

## 2023-12-07 MED ORDER — CHLORHEXIDINE GLUCONATE CLOTH 2 % EX PADS
6.0000 | MEDICATED_PAD | Freq: Every day | CUTANEOUS | Status: DC
  Administered 2023-12-07: 6 via TOPICAL

## 2023-12-07 MED ORDER — ASPIRIN 81 MG PO TBEC
81.0000 mg | DELAYED_RELEASE_TABLET | Freq: Every day | ORAL | Status: DC
  Filled 2023-12-07: qty 1

## 2023-12-07 MED ORDER — PERFLUTREN LIPID MICROSPHERE
1.0000 mL | INTRAVENOUS | Status: AC | PRN
  Administered 2023-12-07: 3 mL via INTRAVENOUS

## 2023-12-07 MED ORDER — BACLOFEN 10 MG PO TABS: 10.0000 mg | ORAL_TABLET | Freq: Three times a day (TID) | ORAL | Status: DC | PRN

## 2023-12-07 MED ORDER — ACETAMINOPHEN 650 MG RE SUPP: 650.0000 mg | RECTAL | Status: DC | PRN

## 2023-12-07 NOTE — Progress Notes (Addendum)
 STROKE TEAM PROGRESS NOTE    SIGNIFICANT HOSPITAL EVENTS  12/4: RLE numbness and weakness Code Stroke  CTH negative.   Home medications include Plavix  and atorvastatin .   TNK given @ 2205  INTERIM HISTORY/SUBJECTIVE  MRI shows small acute distal left ACA infarcts.  CTH/ CTAs to be completed tonight as 24hour post-TNK imaging.   Patient sitting up in bed. No family ay bedside. RN at bedside.  Right facial droop on exam with decreased right hand grip, and decreased fine motor movements in right hand.    OBJECTIVE  CBC    Component Value Date/Time   WBC 10.3 12/06/2023 2109   RBC 4.83 12/06/2023 2109   HGB 15.0 12/06/2023 2127   HCT 44.0 12/06/2023 2127   PLT 256 12/06/2023 2109   MCV 88.8 12/06/2023 2109   MCH 30.4 12/06/2023 2109   MCHC 34.3 12/06/2023 2109   RDW 14.5 12/06/2023 2109   LYMPHSABS 2.8 12/06/2023 2109   MONOABS 0.8 12/06/2023 2109   EOSABS 0.1 12/06/2023 2109   BASOSABS 0.1 12/06/2023 2109    BMET    Component Value Date/Time   NA 138 12/06/2023 2127   K 4.8 12/06/2023 2127   CL 108 12/06/2023 2127   CO2 23 12/06/2023 2109   GLUCOSE 158 (H) 12/06/2023 2127   BUN 16 12/06/2023 2127   CREATININE 1.00 12/06/2023 2127   CALCIUM  8.8 (L) 12/06/2023 2109   GFRNONAA >60 12/06/2023 2109    IMAGING past 24 hours CT HEAD CODE STROKE WO CONTRAST (LKW 0-4.5h, LVO 0-24h) Result Date: 12/06/2023 EXAM: CT HEAD WITHOUT CONTRAST 12/06/2023 09:21:11 PM TECHNIQUE: CT of the head was performed without the administration of intravenous contrast. Automated exposure control, iterative reconstruction, and/or weight based adjustment of the mA/kV was utilized to reduce the radiation dose to as low as reasonably achievable. COMPARISON: None available. CLINICAL HISTORY: Neuro deficit, acute, stroke suspected. FINDINGS: BRAIN AND VENTRICLES: No acute hemorrhage. No evidence of acute infarct. No hydrocephalus. No extra-axial collection. No mass effect or midline shift. [place]  Stroke Program Early CT Score (ASPECTS): Ganglionic (caudate, internal capsule, lentiform nucleus, insula, M1-M3): 7. Supraganglionic (M4-M6): 3. Total: 10. ORBITS: No acute abnormality. SINUSES: No acute abnormality. SOFT TISSUES AND SKULL: No acute soft tissue abnormality. No skull fracture. IMPRESSION: 1. No acute intracranial abnormality. 2. ASPECTS score is 10. 3. Findings were communicated to Dr. Camellia Shark at 09:25 pm on 12/06/2023. Electronically signed by: Franky Stanford MD 12/06/2023 09:26 PM EST RP Workstation: [id number]    Vitals:   12/07/23 0600 12/07/23 0630 12/07/23 0700 12/07/23 0800  BP: (!) 122/105 129/80 125/69 123/65  Pulse: 63 60 60 60  Resp: 13 13 13 15   Temp:    98.7 F (37.1 C)  TempSrc:    Axillary  SpO2: 96% 97% 91% 94%  Weight:      Height:        PHYSICAL EXAM General:  Alert, well-nourished, well-developed patient in no acute distress Psych:  Mood and affect appropriate for situation CV: Regular rate and rhythm on monitor Respiratory:  Regular, unlabored respirations on room air GI: Abdomen soft and nontender  NEURO:  Mental Status: AA&Ox3, patient is able to give clear and coherent history Speech/Language: speech is without dysarthria or aphasia.  Naming, repetition, fluency, and comprehension intact.  Cranial Nerves:  II: PERRL. Visual fields full.  III, IV, VI: EOMI. Eyelids elevate symmetrically.  V: Sensation is intact to light touch and symmetrical to face.  VII: Mild right facial droop VIII: hearing  intact to voice. IX, X: Palate elevates symmetrically. Phonation is normal.  KP:Dynloizm shrug 5/5. XII: tongue is midline without fasciculations. Motor: No drifts in any extremity.  RUE: decreased grip Tone: is normal and bulk is normal Sensation- Intact to light touch bilaterally. Extinction absent to light touch to DSS.   Coordination: FTN intact bilaterally. Decreased fine motor movements R hand.  Gait- deferred  Most Recent NIH: 1.     ASSESSMENT/PLAN  Mr. Anthony Newman is a 68 y.o. male with history of smoker, CAD NIH on Admission 2.  Acute Ischemic Infarcts:  left ACA territory Etiology:  small vessel disease  Code Stroke CT head No acute abnormality. ASPECTS 10.    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 If no hemorrhage, will start  aspirin  81 mg daily and Brilinta  (ticagrelor ) 90 mg bid for 4 weeks and then aspirin  alone. Also discussed Librexia trial with patient today, pending patient decision.  Therapy recommendations:  Pending Disposition:  pending  Hypertension Home meds: Losartan  25 mg daily Stable Blood Pressure Goal: BP less than 180/105   Hyperlipidemia Home meds: Lipitor  80 mg, resumed in hospital LDL 103, goal < 70 Added zetia Continue statin at discharge  Tobacco Abuse Patient is a current smoker       Ready to quit? Yes Nicotine  replacement therapy provided  Other Active Problems   Hospital day # 0   Pt seen by Neuro NP/APP with MD. Note/plan to be edited by MD as needed.    Rocky JAYSON Likes, DNP [facility] Please use AMION for contact information & EPIC for messaging.  I have personally obtained history,examined this patient, reviewed notes, independently viewed imaging studies, participated in medical decision making and plan of care.ROS completed by me personally and pertinent positives fully documented  I have made any additions or clarifications directly to the above note. Agree with note above.  Patient presented with right-sided weakness secondary to left ACA branch infarct.  Continue close neurological observation and strict blood  pressure control as per post TNK protocol.  Continue mobilization out of bed.  Therapy consults.  Check CT angiogram of the brain and neck.  Recommend aspirin  and Brilinta  for 4 weeks followed by aspirin  alone after taking 24 hours CT head if no bleed. Patient may also consider possible participation in the Librexia stroke trial.  Patient was given written information to review and decide.  It was made quite clear study participation voluntary and patient can withdraw at any point on the trial if not satisfied and will get the same excellent medical care irrespective of whether atorvastatin  is still enough. This patient is critically ill and at significant risk of neurological worsening, death and care requires constant monitoring of vital signs, hemodynamics,respiratory and cardiac monitoring, extensive review of multiple databases, frequent neurological assessment, discussion with family, other specialists and medical decision making of high complexity.I have made any additions or clarifications directly to the above note.This critical care time does not reflect procedure time, or teaching time or supervisory time of PA/NP/Med Resident etc but could involve care discussion time.  I spent 30 minutes of neurocritical care time  in the care of  this  patient.     Eather Popp, MD Medical Director [facility] Pager: [phone] 12/07/2023 3:47 PM   To contact Stroke Continuity provider, please refer to [email or web address]. After hours, contact General Neurology

## 2023-12-07 NOTE — Progress Notes (Signed)
 Echocardiogram 2D Echocardiogram has been performed.  Anthony Newman 12/07/2023, 5:00 PM

## 2023-12-07 NOTE — Progress Notes (Signed)
 Echocardiogram 2D Echocardiogram has been performed.  Anthony Newman 12/07/2023, 2:41 PM

## 2023-12-07 NOTE — H&P (Addendum)
 NEUROLOGY ADMISSION HISTORY AND PHYSICAL   Date of service: December 07, 2023 Patient Name: Anthony Newman MRN:  [id number] DOB:  1955/04/04 Chief Complaint: My right leg became weak Requesting Provider: Emil Share, DO  History of Present Illness  Anthony Newman is a 68 y.o. male with a PMHx of CAD and smoking who presents to the ED with acute onset of RLE numbness and weakness, from the hip to his toes. He was lying down in bed awake with LKN of 7:00 PM (the time he got into bed). Subsequently, he got up and could not stand normally due to RLE weakness and numbness. He fell over to his LEFT and was able to get back up with some difficulty. At the time of symptom onset, he had some CP that improved with ASA and nitroglycerin , but which did not help with the leg weakness. He had no right arm weakness or numbness, no facial droop, no vision change, no aphasia and no ataxia. His left side was asymptomatic. BP 123/82.  Home medications include Plavix  and atorvastatin .   LKW: 7:00 PM Modified rankin score: 0 IV Thrombolysis: Yes EVT: No: Presentation is not consistent with LVO  NIHSS components Score: Comment  1a Level of Conscious 0[x]  1[]  2[]  3[]      1b LOC Questions 0[x]  1[]  2[]       1c LOC Commands 0[x]  1[]  2[]       2 Best Gaze 0[x]  1[]  2[]       3 Visual 0[x]  1[]  2[]  3[]      4 Facial Palsy 0[x]  1[]  2[]  3[]      5a Motor Arm - left 0[x]  1[]  2[]  3[]  4[]  UN[]    5b Motor Arm - Right 0[x]  1[]  2[]  3[]  4[]  UN[]    6a Motor Leg - Left 0[x]  1[]  2[]  3[]  4[]  UN[]    6b Motor Leg - Right 0[]  1[x]  2[]  3[]  4[]  UN[]    7 Limb Ataxia 0[x]  1[]  2[]  UN[]      8 Sensory 0[]  1[x]  2[]  UN[]     RLE  9 Best Language 0[x]  1[]  2[]  3[]      10 Dysarthria 0[x]  1[]  2[]  UN[]      11 Extinct. and Inattention 0[x]  1[]  2[]       TOTAL:  2      ROS  Had CP at time of symptom onset that resolved after nitroglycerin . Also had some SOB. Comprehensive ROS performed and other pertinent positives are as documented in HPI     Past History   Past Medical History:  Diagnosis Date   Coronary artery disease     Past Surgical History:  Procedure Laterality Date   LAPAROSCOPIC APPENDECTOMY N/A 06/05/2019   Procedure: APPENDECTOMY LAPAROSCOPIC;  Surgeon: Kinsinger, Herlene Righter, MD;  Location: [facility] OR;  Service: General;  Laterality: N/A;    Family History: History reviewed. No pertinent family history.  Social History  reports that he has been smoking. He does not have any smokeless tobacco history on file. He reports that he does not drink alcohol and does not use drugs.  Allergies  Allergen Reactions   Bee Venom Anaphylaxis   Ketorolac  Nausea And Vomiting    Medications  No current facility-administered medications for this encounter.  Current Outpatient Medications:    albuterol  (VENTOLIN  HFA) 108 (90 Base) MCG/ACT inhaler, Inhale 2 puffs into the lungs every 6 (six) hours as needed., Disp: 6.7 g, Rfl: 6   aspirin  EC 81 MG tablet, Take 81 mg by mouth daily. , Disp: , Rfl:  atorvastatin  (LIPITOR ) 80 MG tablet, Take 1 tablet (80 mg total) by mouth daily., Disp: 90 tablet, Rfl: 1   baclofen  (LIORESAL ) 10 MG tablet, Take 1 tablet (10 mg total) by mouth every 8 (eight) hours as needed for muscle spasm., Disp: 60 tablet, Rfl: 3   carvedilol  (COREG ) 6.25 MG tablet, Take 1 tablet (6.25 mg total) by mouth 2 (two) times daily., Disp: 180 tablet, Rfl: 1   clopidogrel  (PLAVIX ) 75 MG tablet, Take 1 tablet (75 mg total) by mouth daily. (Patient taking differently: Take 75 mg by mouth every other day.), Disp: 90 tablet, Rfl: 1   dicyclomine  (BENTYL ) 10 MG capsule, Take 1 capsule (10 mg total) by mouth 3 (three) times daily - before meals and at bedtime., Disp: 120 capsule, Rfl: 5   famotidine  (PEPCID ) 20 MG tablet, Take 1 tablet (20 mg total) by mouth 2 (two) times daily., Disp: 180 tablet, Rfl: 0   gabapentin  (NEURONTIN ) 300 MG capsule, Take 1 capsule (300 mg total) by mouth 3 (three) times daily for chronic back  and neck pain. (Patient taking differently: Take 300 mg by mouth 2 (two) times daily.), Disp: 90 capsule, Rfl: 2   GI Cocktail (alum & mag hydroxide-simethicone /lidocaine )oral mixture, Take 15 mL (1 tablespoonful) every 6 hours as needed for acid reflux pain., Disp: 360 mL, Rfl: 5   HYDROcodone -acetaminophen  (NORCO) 10-325 MG tablet, Take 1 tablet by mouth 2 (two) times daily. (Patient taking differently: Take 0.5-1 tablets by mouth 2 (two) times daily.), Disp: 60 tablet, Rfl: 0   losartan  (COZAAR ) 25 MG tablet, Take 1 tablet (25 mg total) by mouth daily. Discontinue quinapril , Disp: 90 tablet, Rfl: 1   montelukast  (SINGULAIR ) 10 MG tablet, Take 1 tablet (10 mg total) by mouth daily for chronic allergies., Disp: 90 tablet, Rfl: 1   nitroGLYCERIN  (NITROSTAT ) 0.4 MG SL tablet, Place 0.4 mg under the tongue every 5 (five) minutes as needed for chest pain., Disp: , Rfl:    sertraline  (ZOLOFT ) 50 MG tablet, Take 1 tablet (50 mg total) by mouth daily., Disp: 90 tablet, Rfl: 1   spironolactone  (ALDACTONE ) 25 MG tablet, Take 12.5 mg by mouth daily., Disp: , Rfl:    Vitamin D , Ergocalciferol , (DRISDOL ) 1.25 MG (50000 UNIT) CAPS capsule, Take 1 capsule (50,000 Units total) by mouth once a week., Disp: 5 capsule, Rfl: 2   albuterol  (VENTOLIN  HFA) 108 (90 Base) MCG/ACT inhaler, Inhale 2 puffs into the lungs every 6 (six) hours as needed., Disp: 6.7 g, Rfl: 6   albuterol  (VENTOLIN  HFA) 108 (90 Base) MCG/ACT inhaler, Inhale 2 puffs into the lungs every 6 (six) hours as needed for wheezing, Disp: 6.7 g, Rfl: 6   atorvastatin  (LIPITOR ) 80 MG tablet, Take 1 tablet (80 mg total) by mouth daily., Disp: 90 tablet, Rfl: 1   atorvastatin  (LIPITOR ) 80 MG tablet, Take 1 tablet (80 mg total) by mouth daily., Disp: 90 tablet, Rfl: 1   atorvastatin  (LIPITOR ) 80 MG tablet, Take 1 tablet (80 mg total) by mouth daily., Disp: 90 tablet, Rfl: 1   baclofen  (LIORESAL ) 10 MG tablet, Take 1 tablet (10 mg total) by mouth every 8 (eight)  hours as needed for muscle spasm, Disp: 60 tablet, Rfl: 3   baclofen  (LIORESAL ) 10 MG tablet, Take 1 tablet (10 mg total) by mouth every 8 (eight) hours as needed for muscle spasm, Disp: 60 tablet, Rfl: 3   baclofen  (LIORESAL ) 10 MG tablet, Take 1 tablet (10 mg total) by mouth every 8 (eight) hours as  needed for muscle spasms, Disp: 60 tablet, Rfl: 3   chlorhexidine  (PERIDEX ) 0.12 % solution, Swish for one minute and expectorate twice daily for 10 days. (Patient not taking: Reported on December 16, 2023), Disp: 473 mL, Rfl: 0   dicyclomine  (BENTYL ) 10 MG capsule, Take 1 capsule (10 mg total) by mouth 4 (four) times daily -  before meals and at bedtime., Disp: 120 capsule, Rfl: 5   EPINEPHrine  0.3 mg/0.3 mL IJ SOAJ injection, Inject as needed for severe reaction, repeat in 3 minutes if not improving, Disp: 2 each, Rfl: 2   gabapentin  (NEURONTIN ) 300 MG capsule, Take 1 capsule (300 mg total) by mouth 3 (three) times daily., Disp: 90 capsule, Rfl: 2   losartan  (COZAAR ) 25 MG tablet, Take 1 tablet (25 mg total) by mouth daily., Disp: 90 tablet, Rfl: 1   montelukast  (SINGULAIR ) 10 MG tablet, Take 1 tablet (10 mg total) by mouth daily for chronic allergies., Disp: 30 tablet, Rfl: 3   montelukast  (SINGULAIR ) 10 MG tablet, Take 1 tablet by mouth daily for chronic allergies, Disp: 30 tablet, Rfl: 3   montelukast  (SINGULAIR ) 10 MG tablet, Take 1 tablet by mouth daily for chronic allergies, Disp: 30 tablet, Rfl: 5   montelukast  (SINGULAIR ) 10 MG tablet, Take 1 tablet (10 mg total) by mouth daily for chronic allergies, Disp: 30 tablet, Rfl: 5   montelukast  (SINGULAIR ) 10 MG tablet, Take 1 tablet (10 mg total) by mouth daily for chronic allergies., Disp: 30 tablet, Rfl: 5   sertraline  (ZOLOFT ) 50 MG tablet, Take 1 tablet (50 mg total) by mouth daily., Disp: 90 tablet, Rfl: 1   sertraline  (ZOLOFT ) 50 MG tablet, Take 1 tablet (50 mg total) by mouth daily., Disp: 90 tablet, Rfl: 1   Vitamin D , Ergocalciferol , (DRISDOL ) 1.25  MG (50000 UNIT) CAPS capsule, Take 1 capsule (50,000 Units total) by mouth once a week., Disp: 5 capsule, Rfl: 2  Vitals   Vitals:   2023/12/16 2250 16-Dec-2023 2315 12-16-2023 2330 12-16-23 2345  BP: 114/71 115/70 115/75 114/71  Pulse: 77 77 72 73  Resp: 16 17 12 16   Temp:      TempSrc:      SpO2: 96% 96% 96% 96%  Weight:      Height:        Body mass index is 24.14 kg/m.   Physical Exam   Constitutional: Appears well-developed and well-nourished.  Psych: Affect appropriate to situation.  Eyes: No scleral injection.  HENT: No OP obstruction.  Head: Normocephalic.  Respiratory: Effort normal, non-labored breathing.  Skin: WDI.   Neurologic Examination   Reflexes are 1+ and symmetric throughout with downgoing toes. See NIHSS for other exam details.   Labs/Imaging/Neurodiagnostic studies   CBC:  Recent Labs  Lab 12-16-2023 2109 12-16-2023 2127  WBC 10.3  --   NEUTROABS 6.5  --   HGB 14.7 15.0  HCT 42.9 44.0  MCV 88.8  --   PLT 256  --    Basic Metabolic Panel:  Lab Results  Component Value Date   NA 138 12-16-2023   K 4.8 12-16-2023   CO2 23 16-Dec-2023   GLUCOSE 158 (H) 12-16-2023   BUN 16 2023-12-16   CREATININE 1.00 16-Dec-2023   CALCIUM  8.8 (L) December 16, 2023   GFRNONAA >60 12-16-23   GFRAA >60 06/05/2019   Lipid Panel: No results found for: LDLCALC HgbA1c: No results found for: HGBA1C Urine Drug Screen:     Component Value Date/Time   LABOPIA POSITIVE (A) 12-16-2023 2109   COCAINSCRNUR  NONE DETECTED 12/06/2023 2109   LABBENZ NONE DETECTED 12/06/2023 2109   AMPHETMU NONE DETECTED 12/06/2023 2109   THCU NONE DETECTED 12/06/2023 2109   LABBARB NONE DETECTED 12/06/2023 2109    Alcohol Level     Component Value Date/Time   [facility] <15 12/06/2023 2109   INR  Lab Results  Component Value Date   INR 1.0 12/06/2023   APTT  Lab Results  Component Value Date   APTT 35 12/06/2023     ASSESSMENT  68 y.o. male with a PMHx of CAD and smoking who presents  to the ED with acute onset of RLE numbness and weakness, from the hip to his toes. He was lying down in bed awake with LKN of 7:00 PM (the time he got into bed). Subsequently, he got up and could not stand normally due to RLE weakness and numbness. He fell over to his LEFT and was able to get back up with some difficulty. At the time of symptom onset, he had some CP that improved with ASA and nitroglycerin , but which did not help with the leg weakness. He had no right arm weakness or numbness, no facial droop, no vision change, no aphasia and no ataxia. His left side was asymptomatic. Home medications include Plavix  and atorvastatin .  - Exam reveals RLE weakness and numbness. NIHSS 2.  - CT head: No acute intracranial abnormality. ASPECTS score is 10. - EKG: Sinus rhythm Anterior infarct, old - Labs: - UDS positive for opiates (on opiate pain medication at home) - EtOH negative.  - Glucose 160 - Coags normal - CBC normal Na, K, BUN, Cr are normal - Ionized calcium  is low at 1.00. Total serum calcium  borderline low at 8.8 with normal albumin. LFTs normal.  - Impression: - Acute onset of RLE weakness and numbness. DDx includes acute left ACA territory stroke. Although he has LBP, the numbness and weakness involves the entire leg, which militates against a radiculopathy.  - After comprehensive review of possible contraindications, he has no absolute contraindications to TNK administration. - Patient is a TNK candidate. Discussed extensively the risks/benefits of TNK treatment vs. no treatment with the patient, including risks of hemorrhage and death with IV thrombolytic administration versus worse overall outcomes on average in patients within the thrombolysis time window who are not administered TNK. Overall benefits of TNK regarding long-term prognosis are felt to outweigh risks. The patient expressed understanding and wish to proceed with TNK. Some delay in administration of TNK due to patient wishing  to take some time to discuss with his relative by telephone prior to consenting.   RECOMMENDATIONS  1. Admitting to Neuro ICU.  2. Post-TNK order set to include frequent neuro checks and BP management.  3. No antiplatelet medications or anticoagulants for at least 24 hours following TNK.  4. DVT prophylaxis with SCDs.  5. Continue atorvastatin .  6. Will need escalation of antiplatelet therapy to DAPT if follow up CT at 24 hours is negative for hemorrhagic conversion. 7. CTA of head and neck 8. TTE.  9. MRI brain  10. PT/OT/Speech.  11. NPO until passes swallow evaluation.  12. Telemetry monitoring 13. Fasting lipid panel, HgbA1c 14. Patient states that he wishes to be designated as Full Code  ______________________________________________________________________    Bonney SHARK, Adrick Kestler, MD [facility]

## 2023-12-07 NOTE — Progress Notes (Signed)
 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  Pt at Supervision to [facility] assist level for overall mobility.  Pt currently limited functionally due to the problems listed below.  (see problems list.)  Pt will benefit from PT to maximize function and safety to be able to get home safely with available assist.  Pt may not need any follow up if progresses well.  Will continue to see if stays over there next few days.      12/07/23 1146  PT Visit Information  Last PT Received On 12/07/23  Assistance Needed +1  History of Present Illness 68 yo M adm 12/5  w/ R side weakness and numbness given TNK MRI (+) Small acute distal L ACA infarct , small chronic R cerebellar infarct NIH 2 CT (negative) PMH CAD smoker  Precautions  Precautions  (lower fall risk)  Recall of Precautions/Restrictions Intact  Home Living  Family/patient expects to be discharged to: Private residence  Living Arrangements Other relatives  Available Help at Discharge Family;Available PRN/intermittently (niece presently out sith shingles)  Type of Home House  Home Access Stairs to enter  Entrance Stairs-Number of Steps 2  Entrance Stairs-Rails Right;Left  Home Layout One level  Bathroom Conservation Officer, [employer] Build[employer] Handicapped height  Bathroom Accessibility Yes  Home Equipment None  Prior Function  Prior Level of Function  Independent/Modified Independent  Pain Assessment  Pain Assessment Faces  Faces Pain Scale 0  Pain Intervention(s) Monitored during session  Cognition  Arousal Alert  Behavior During Therapy WFL for tasks assessed/performed  PT - Cognitive impairments No apparent impairments  Following Commands  Following commands Intact  Cueing  Cueing Techniques Verbal cues  Communication  Communication No apparent difficulties  Upper Extremity Assessment  Upper Extremity Assessment Overall WFL for tasks assessed  Lower Extremity  Assessment  Lower Extremity Assessment Overall WFL for tasks assessed;RLE deficits/detail (R LE has now approached baseline function. with leftover weakness medially to the R knee.)  RLE Sensation  (all sensation has returned)  RLE Coordination  (close to baseline)  Cervical / Trunk Assessment  Cervical / Trunk Assessment Normal  Bed Mobility  Overal bed mobility Needs Assistance  Bed Mobility Supine to Sit  Supine to sit Modified independent (Device/Increase time)  Transfers  Overall transfer level Needs assistance  Transfers Sit to/from Stand;Bed to chair/wheelchair/BSC  Sit to Stand Supervision  Bed to/from chair/wheelchair/BSC transfer type: Step pivot  Step pivot transfers Supervision  General transfer comment pt used UE assist appropriately with lower surfaces, but no assist.  Ambulation/Gait  Ambulation/Gait assistance Supervision  Gait Distance (Feet) 300 Feet  Assistive device None  Gait Pattern/deviations Step-through pattern  General Gait Details 1 or 2 initial deviations/drifts L, but then coordinated gait, though slower.  Gait velocity interpretation 1.31 - 2.62 ft/sec, indicative of limited community ambulator  Balance  Overall balance assessment No apparent balance deficits (not formally assessed)  General Comments  General comments (skin integrity, edema, etc.) VSS overall, pt subjectively reports a present function about 90% of normal.  PT - End of Session  Equipment Utilized During Treatment Gait belt  Activity Tolerance Patient tolerated treatment well  Patient left in chair;with call bell/phone within reach  Nurse Communication Mobility status  PT Assessment  PT Recommendation/Assessment Patient needs continued PT services  PT Visit Diagnosis Other abnormalities of gait and mobility (R26.89);Other symptoms and signs involving the nervous system (R29.898)  PT Problem List  Decreased strength;Decreased activity tolerance;Decreased mobility;Decreased  coordination  PT Plan  PT Frequency (ACUTE ONLY) Min 2X/week  PT Treatment/Interventions (ACUTE ONLY) Gait training;Stair training;Functional mobility training;Therapeutic activities;Neuromuscular re-education;Patient/family education  AM-PAC PT 6 Clicks Mobility Outcome Measure (Version 2)  Help needed turning from your back to your side while in a flat bed without using bedrails? 4  Help needed moving from lying on your back to sitting on the side of a flat bed without using bedrails? 4  Help needed moving to and from a bed to a chair (including a wheelchair)? 3  Help needed standing up from a chair using your arms (e.g., wheelchair or bedside chair)? 3  Help needed to walk in hospital room? 3  Help needed climbing 3-5 steps with a railing?  3  6 Click Score 20  Consider Recommendation of Discharge To: Home with no services  Progressive Mobility  What is the highest level of mobility based on the mobility assessment? Level 4 (Ambulates with assistance) - Balance while stepping forward/back - Complete  Mobility Referral Yes  Activity Ambulated with assistance  PT Recommendation  Follow Up Recommendations Other (comment) (likely no follow up if he progresses a bit further,)  Patient can return home with the following  (PRN assist)  Functional Status Assessment Patient has had a recent decline in their functional status and demonstrates the ability to make significant improvements in function in a reasonable and predictable amount of time.  PT equipment None recommended by PT  Individuals Consulted  Consulted and Agree with Results and Recommendations Patient  Acute Rehab PT Goals  Patient Stated Goal Independent  PT Goal Formulation With patient  Time For Goal Achievement 12/10/23  Potential to Achieve Goals Good  PT Time Calculation  PT Start Time (ACUTE ONLY) 1125  PT Stop Time (ACUTE ONLY) 1150  PT Time Calculation (min) (ACUTE ONLY) 25 min  PT General Charges  $$ ACUTE PT  VISIT 1 Visit  PT Evaluation  $PT Eval Moderate Complexity 1 Mod  PT Treatments  $Gait Training 8-22 mins    12/07/2023  India HERO., PT Acute Rehabilitation Services [phone]  (office)

## 2023-12-07 NOTE — Telephone Encounter (Signed)
 Pharmacy Patient Advocate Encounter  Insurance verification completed.    The patient is insured through [employer]. Patient has Medicare and is not eligible for a copay card, but may be able to apply for patient assistance or Medicare RX Payment Plan (Patient Must reach out to their plan, if eligible for payment plan), if available.    Ran test claim for Brilinta  90mg  tablet and the current 30 day co-pay is $4.90.   This test claim was processed through [facility]- copay amounts may vary at other pharmacies due to pharmacy/plan contracts, or as the patient moves through the different stages of their insurance plan.

## 2023-12-07 NOTE — Discharge Instructions (Signed)
 Dear Anthony Newman,   Congratulations for your interest in quitting smoking!  Find a program that suits you best: when you want to quit, how you need support, where you live, and how you like to learn.    If you're ready to get started TODAY, consider scheduling a visit through [facility] [email or web address].  Appointments are available from 8am to 8pm, Monday to Friday.   Most health insurance plans will cover some level of tobacco cessation visits and medications.    Additional Resources: [employer] are also available to help you quit & provide the support you'll need. Many programs are available in both English and Spanish and have a long history of successfully helping people get off and stay off tobacco.    Quit Smoking Apps:  quitSTART at seriousbroker.de QuitGuide?at forgetparking.dk Online education and resources: Smokefree  at borders group.gov Free Telephone Coaching: QuitNow,  Call 1-800-QUIT-NOW ((857)119-1012) or Text- Ready to (779)135-0420 *Quitline [place] has teamed up with Medicaid to offer a free 14 week program    Vaping- Want to Quit? Free 24/7 support. Call [facility]  [place], [place], [place], [place], [place]  [facility]

## 2023-12-08 DIAGNOSIS — F17200 Nicotine dependence, unspecified, uncomplicated: Secondary | ICD-10-CM | POA: Diagnosis present

## 2023-12-08 DIAGNOSIS — I10 Essential (primary) hypertension: Secondary | ICD-10-CM | POA: Diagnosis present

## 2023-12-08 DIAGNOSIS — E785 Hyperlipidemia, unspecified: Secondary | ICD-10-CM | POA: Diagnosis present

## 2023-12-08 LAB — BASIC METABOLIC PANEL WITH GFR
Anion gap: 10 (ref 5–15)
BUN: 8 mg/dL (ref 8–23)
CO2: 24 mmol/L (ref 22–32)
Calcium: 8.6 mg/dL — ABNORMAL LOW (ref 8.9–10.3)
Chloride: 101 mmol/L (ref 98–111)
Creatinine, Ser: 0.92 mg/dL (ref 0.61–1.24)
GFR, Estimated: >60 mL/min (ref >60)
Glucose, Bld: 151 mg/dL — ABNORMAL HIGH (ref 70–99)
Potassium: 3.4 mmol/L — ABNORMAL LOW (ref 3.5–5.1)
Sodium: 135 mmol/L (ref 135–145)

## 2023-12-08 LAB — CBC
HCT: 40.9 % (ref 39.0–52.0)
Hemoglobin: 14 g/dL (ref 13.0–17.0)
MCH: 30.6 pg (ref 26.0–34.0)
MCHC: 34.2 g/dL (ref 30.0–36.0)
MCV: 89.3 fL (ref 80.0–100.0)
Platelets: 214 K/uL (ref 150–400)
RBC: 4.58 MIL/uL (ref 4.22–5.81)
RDW: 14.5 % (ref 11.5–15.5)
WBC: 7.6 K/uL (ref 4.0–10.5)
nRBC: 0 % (ref 0.0–0.2)

## 2023-12-08 MED ORDER — POTASSIUM CHLORIDE CRYS ER 20 MEQ PO TBCR
20.0000 meq | EXTENDED_RELEASE_TABLET | Freq: Once | ORAL | Status: AC
  Administered 2023-12-08: 20 meq via ORAL

## 2023-12-08 MED ORDER — CLOPIDOGREL BISULFATE 75 MG PO TABS: 75.0000 mg | ORAL_TABLET | Freq: Every day | ORAL | 0 refills | Status: AC

## 2023-12-08 MED ORDER — ASPIRIN 81 MG PO TBEC
81.0000 mg | DELAYED_RELEASE_TABLET | Freq: Every day | ORAL | Status: DC
  Administered 2023-12-08: 81 mg via ORAL
  Filled 2023-12-08: qty 1

## 2023-12-08 MED ORDER — TICAGRELOR 90 MG PO TABS: 90.0000 mg | ORAL_TABLET | Freq: Two times a day (BID) | ORAL | 0 refills | Status: AC

## 2023-12-08 MED ORDER — ENOXAPARIN SODIUM 40 MG/0.4ML IJ SOSY: 40.0000 mg | PREFILLED_SYRINGE | Freq: Every day | INTRAMUSCULAR | Status: DC

## 2023-12-08 MED ORDER — ATORVASTATIN CALCIUM 80 MG PO TABS: 80.0000 mg | ORAL_TABLET | Freq: Every day | ORAL | 1 refills | Status: AC

## 2023-12-08 MED ORDER — POTASSIUM CHLORIDE 20 MEQ PO PACK
20.0000 meq | PACK | Freq: Once | ORAL | Status: DC
  Filled 2023-12-08: qty 1

## 2023-12-08 MED ORDER — TICAGRELOR 90 MG PO TABS
90.0000 mg | ORAL_TABLET | Freq: Two times a day (BID) | ORAL | Status: DC
  Administered 2023-12-08: 90 mg via ORAL
  Filled 2023-12-08: qty 1

## 2023-12-08 MED ORDER — ASPIRIN 81 MG PO TBEC: 81.0000 mg | DELAYED_RELEASE_TABLET | Freq: Every day | ORAL | 12 refills | Status: AC

## 2023-12-08 MED ORDER — NICOTINE 7 MG/24HR TD PT24: 7.0000 mg | MEDICATED_PATCH | Freq: Every day | TRANSDERMAL | 0 refills | Status: AC

## 2023-12-08 NOTE — Progress Notes (Signed)
 Stroke Expertise provided to patient. Resource guide reviewed with nurse for applicable diagnosis. Appropriate stroke care and orders confirmed in place.  For any questions related to patient's stroke care, please call:  Stroke Response Nurse (Monday - Friday 0700-1900): [phone]  4 [phone] Nurse (Nights and Weekends): [phone]

## 2023-12-08 NOTE — Discharge Summary (Addendum)
 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   aspirin  EC 81 MG tablet Take 1 tablet (81 mg total) by mouth daily. Swallow whole. Start taking on: December 09, 2023 What changed: additional instructions   atorvastatin  80 MG tablet Commonly known as: LIPITOR  Take 1 tablet (80 mg total) by mouth daily.   baclofen  10 MG tablet Commonly known as: LIORESAL  Take 1 tablet (10 mg total) by mouth every 8 (eight) hours as needed for muscle spasm.   carvedilol  6.25 MG tablet Commonly known as: COREG  Take 1 tablet (6.25 mg total) by mouth 2 (two) times daily.   clopidogrel  75 MG tablet Commonly known as: PLAVIX  Take 1 tablet (75 mg total) by mouth daily. Start taking on: January 07, 2024 What changed: These instructions start on January 07, 2024. If you are unsure what to do until then, ask your doctor or other care provider.   dicyclomine  10 MG capsule Commonly known as: BENTYL  Take 1 capsule (10 mg total) by mouth 3 (three) times daily - before meals and at bedtime.   famotidine  20 MG tablet Commonly known as: PEPCID  Take 1 tablet (20 mg total) by mouth 2 (two)  times daily.   gabapentin  300 MG capsule Commonly known as: NEURONTIN  Take 1 capsule (300 mg total) by mouth 3 (three) times daily for chronic back and neck pain. What changed: when to take this   GI Cocktail (alum & mag hydroxide-simethicone /lidocaine )oral mixture Take 15 mL (1 tablespoonful) every 6 hours as needed for acid reflux pain.   HYDROcodone -acetaminophen  10-325 MG tablet Commonly known as: NORCO Take 1 tablet by mouth 2 (two) times daily. What changed: how much to take   losartan  25 MG tablet Commonly known as: COZAAR  Take 1 tablet (25 mg total) by mouth daily. Discontinue quinapril    montelukast  10 MG tablet Commonly known as: SINGULAIR  Take 1 tablet (10 mg total) by mouth daily for chronic allergies.   nicotine  7 mg/24hr patch Commonly known as: NICODERM CQ  - dosed in mg/24 hr Place 1 patch (7 mg total) onto the skin daily. Start taking on: December 09, 2023   nitroGLYCERIN  0.4 MG SL tablet Commonly known as: NITROSTAT  Place 0.4 mg under the tongue every 5 (five) minutes as needed for chest pain.   sertraline  50 MG tablet Commonly known as: ZOLOFT  Take 1 tablet (50 mg total) by mouth daily.   ticagrelor  90 MG Tabs tablet Commonly known as: BRILINTA  Take 1 tablet (90 mg total) by mouth 2 (two) times daily.   Vitamin D  (Ergocalciferol ) 1.25 MG (50000 UNIT) Caps capsule  Commonly known as: DRISDOL  Take 1 capsule (50,000 Units total) by mouth once a week.        LABORATORY STUDIES CBC    Component Value Date/Time   WBC 7.6 12/08/2023 0434   RBC 4.58 12/08/2023 0434   HGB 14.0 12/08/2023 0434   HCT 40.9 12/08/2023 0434   PLT 214 12/08/2023 0434   MCV 89.3 12/08/2023 0434   MCH 30.6 12/08/2023 0434   MCHC 34.2 12/08/2023 0434   RDW 14.5 12/08/2023 0434   LYMPHSABS 2.8 12/06/2023 2109   MONOABS 0.8 12/06/2023 2109   EOSABS 0.1 12/06/2023 2109   BASOSABS 0.1 12/06/2023 2109   CMP    Component Value Date/Time   NA 135 12/08/2023 0434   K 3.4  (L) 12/08/2023 0434   CL 101 12/08/2023 0434   CO2 24 12/08/2023 0434   GLUCOSE 151 (H) 12/08/2023 0434   BUN 8 12/08/2023 0434   CREATININE 0.92 12/08/2023 0434   CALCIUM  8.6 (L) 12/08/2023 0434   PROT 7.1 12/06/2023 2109   ALBUMIN 3.7 12/06/2023 2109   AST 19 12/06/2023 2109   ALT 18 12/06/2023 2109   ALKPHOS 90 12/06/2023 2109   BILITOT 0.7 12/06/2023 2109   GFRNONAA >60 12/08/2023 0434   GFRAA >60 06/05/2019 2301   COAGS Lab Results  Component Value Date   INR 1.0 12/06/2023   INR 1.00 03/11/2010   Lipid Panel    Component Value Date/Time   CHOL 156 12/07/2023 0351   TRIG 64 12/07/2023 0351   HDL 40 (L) 12/07/2023 0351   CHOLHDL 3.9 12/07/2023 0351   VLDL 13 12/07/2023 0351   LDLCALC 103 (H) 12/07/2023 0351   HgbA1C  Lab Results  Component Value Date   HGBA1C 6.0 (H) 12/07/2023   Urinalysis    Component Value Date/Time   COLORURINE YELLOW 03/11/2010 1247   APPEARANCEUR CLEAR 03/11/2010 1247   LABSPEC 1.030 03/11/2010 1247   PHURINE 5.5 03/11/2010 1247   GLUCOSEU NEGATIVE 03/11/2010 1247   HGBUR NEGATIVE 03/11/2010 1247   BILIRUBINUR SMALL (A) 03/11/2010 1247   KETONESUR NEGATIVE 03/11/2010 1247   PROTEINUR NEGATIVE 03/11/2010 1247   UROBILINOGEN 1.0 03/11/2010 1247   NITRITE NEGATIVE 03/11/2010 1247   LEUKOCYTESUR  03/11/2010 1247    NEGATIVE MICROSCOPIC NOT DONE ON URINES WITH NEGATIVE PROTEIN, BLOOD, LEUKOCYTES, NITRITE, OR GLUCOSE <1000 mg/dL.   Urine Drug Screen     Component Value Date/Time   LABOPIA POSITIVE (A) 12/06/2023 2109   COCAINSCRNUR NONE DETECTED 12/06/2023 2109   LABBENZ NONE DETECTED 12/06/2023 2109   AMPHETMU NONE DETECTED 12/06/2023 2109   THCU NONE DETECTED 12/06/2023 2109   LABBARB NONE DETECTED 12/06/2023 2109    Alcohol Level    Component Value Date/Time   [facility] <15 12/06/2023 2109     SIGNIFICANT DIAGNOSTIC STUDIES CT ANGIO HEAD W OR WO CONTRAST Result Date: 12/07/2023 EXAM: CTA Head and Neck with Intravenous  Contrast. CT Head without Contrast. CLINICAL HISTORY: Stroke suspected (Ped 0-17y) TECHNIQUE: Axial CTA images of the head and neck performed with intravenous contrast. MIP reconstructed images were created and reviewed. Axial computed tomography images of the head/brain performed without intravenous contrast. Note: Per PQRS, the description of internal carotid artery percent stenosis, including 0 percent or normal exam, is based on [employer] Symptomatic Carotid Endarterectomy Trial (NASCET) criteria. Dose reduction technique was used including one or more of the following: automated exposure control, adjustment of mA and kV according to patient size, and/or iterative reconstruction. CONTRAST: 80mL Omnipaque  IV COMPARISON:  MRI Head 12/07/23 FINDINGS: CT HEAD: BRAIN: No acute intraparenchymal hemorrhage. No mass lesion. Known left ACA territory infarcts were better characterized on same day MRI. No midline shift or extra-axial collection. VENTRICLES: No hydrocephalus. ORBITS: The orbits are unremarkable. SINUSES AND MASTOIDS: The paranasal sinuses and mastoid air cells are clear. CTA NECK: COMMON CAROTID ARTERIES: No significant stenosis. No dissection or occlusion. INTERNAL CAROTID ARTERIES: No stenosis by NASCET criteria. No dissection or occlusion. VERTEBRAL ARTERIES: No significant stenosis. No dissection or occlusion. CTA HEAD: ANTERIOR CEREBRAL ARTERIES: Hypoplastic right A1 ACA. No significant stenosis. No occlusion. No aneurysm. MIDDLE CEREBRAL ARTERIES: No significant stenosis. No occlusion. No aneurysm. POSTERIOR CEREBRAL ARTERIES: Bilateral fetal type PCAs with small vertebrobasilar system, anatomic variant. No significant stenosis. No occlusion. No aneurysm. BASILAR ARTERY: No significant stenosis. No occlusion. No aneurysm. OTHER: SOFT TISSUES: No acute finding. No masses or lymphadenopathy. BONES: No acute osseous abnormality. IMPRESSION: 1. No large vessel occlusion or significant stenosis.  Electronically signed by: Gilmore Molt MD 12/07/2023 11:41 PM EST RP Workstation: [id number]   ECHOCARDIOGRAM LIMITED Result Date: 12/07/2023    ECHOCARDIOGRAM LIMITED REPORT   Patient Name:   ACEYN KATHOL Date of Exam: 12/07/2023 Medical Rec #:  [id number]      Height:       68.0 in Accession #:    [id number]     Weight:       158.7 lb Date of Birth:  02-Sep-1955     BSA:          1.853 m Patient Age:    68 years       BP:           132/92 mmHg Patient Gender: M              HR:           66 bpm. Exam Location:  Inpatient Procedure: Limited Echo and Intracardiac Opacification Agent Indications:    Stroke  History:        Patient has prior history of Echocardiogram examinations, most                 recent 12/07/2023. CAD, Stroke; Risk Factors:Current Smoker.  Sonographer:    Juliene Rucks Referring Phys: [id number] PRAMOD S SETHI IMPRESSIONS  1. Limited study with definity  to R/O apical thrombus; apical swirling noted suggestive of low flow but no obvious thrombus. FINDINGS  Left Ventricle: Definity  contrast agent was given IV to delineate the left ventricular endocardial borders. Additional Comments: Limited study with definity  to R/O apical thrombus; apical swirling noted suggestive of low flow but no obvious thrombus.  Redell Shallow MD Electronically signed by Redell Shallow MD Signature Date/Time: 12/07/2023/5:25:39 PM    Final    ECHOCARDIOGRAM COMPLETE Result Date: 12/07/2023    ECHOCARDIOGRAM REPORT   Patient Name:   ALVEN ALVERIO Date of Exam: 12/07/2023 Medical Rec #:  [id number]      Height:       68.0 in Accession #:    [id number]     Weight:       158.7 lb Date of Birth:  1955-10-07     BSA:          1.853 m Patient Age:    68 years       BP:           120/82 mmHg Patient Gender: M              HR:  68 bpm. Exam Location:  Inpatient Procedure: 2D Echo, Cardiac Doppler and Color Doppler (Both Spectral and Color            Flow Doppler were utilized during procedure). Indications:    Stroke   History:        Patient has no prior history of Echocardiogram examinations.                 CAD, Stroke; Risk Factors:Current Smoker.  Sonographer:    VALENTE, ADAM Referring Phys: [id number] ERIC LINDZEN IMPRESSIONS  1. Apical hypokinesis with overall low normal LV function. Suggest FU study with definity  or cardiac MRI to R/O apical thrombus.  2. Left ventricular ejection fraction, by estimation, is 50 to 55%. The left ventricle has low normal function. The left ventricle demonstrates regional wall motion abnormalities (see scoring diagram/findings for description). There is mild left ventricular hypertrophy. Left ventricular diastolic parameters are consistent with Grade I diastolic dysfunction (impaired relaxation).  3. Right ventricular systolic function is normal. The right ventricular size is normal.  4. The mitral valve is normal in structure. No evidence of mitral valve regurgitation. No evidence of mitral stenosis.  5. The aortic valve is tricuspid. Aortic valve regurgitation is not visualized. No aortic stenosis is present.  6. The inferior vena cava is normal in size with greater than 50% respiratory variability, suggesting right atrial pressure of 3 mmHg. Comparison(s): No prior Echocardiogram. FINDINGS  Left Ventricle: Left ventricular ejection fraction, by estimation, is 50 to 55%. The left ventricle has low normal function. The left ventricle demonstrates regional wall motion abnormalities. The left ventricular internal cavity size was normal in size. There is mild left ventricular hypertrophy. Left ventricular diastolic parameters are consistent with Grade I diastolic dysfunction (impaired relaxation). Right Ventricle: The right ventricular size is normal. Right ventricular systolic function is normal. Left Atrium: Left atrial size was normal in size. Right Atrium: Right atrial size was normal in size. Pericardium: There is no evidence of pericardial effusion. Mitral Valve: The mitral valve is normal in  structure. No evidence of mitral valve regurgitation. No evidence of mitral valve stenosis. Tricuspid Valve: The tricuspid valve is normal in structure. Tricuspid valve regurgitation is trivial. No evidence of tricuspid stenosis. Aortic Valve: The aortic valve is tricuspid. Aortic valve regurgitation is not visualized. No aortic stenosis is present. Pulmonic Valve: The pulmonic valve was normal in structure. Pulmonic valve regurgitation is not visualized. No evidence of pulmonic stenosis. Aorta: The aortic root is normal in size and structure. Venous: The inferior vena cava is normal in size with greater than 50% respiratory variability, suggesting right atrial pressure of 3 mmHg. IAS/Shunts: No atrial level shunt detected by color flow Doppler. Additional Comments: Apical hypokinesis with overall low normal LV function. Suggest FU study with definity  or cardiac MRI to R/O apical thrombus.  LEFT VENTRICLE PLAX 2D LVIDd:         3.60 cm     Diastology LVIDs:         2.70 cm     LV e' medial:    6.20 cm/s LV PW:         1.30 cm     LV E/e' medial:  8.3 LV IVS:        1.20 cm     LV e' lateral:   8.38 cm/s LVOT diam:     2.10 cm     LV E/e' lateral: 6.2 LV SV:         67 LV  SV Index:   36 LVOT Area:     3.46 cm  LV Volumes (MOD) LV vol d, MOD A2C: 54.6 ml LV vol d, MOD A4C: 94.7 ml LV vol s, MOD A2C: 28.7 ml LV vol s, MOD A4C: 48.1 ml LV SV MOD A2C:     25.9 ml LV SV MOD A4C:     94.7 ml LV SV MOD BP:      34.4 ml RIGHT VENTRICLE RV Basal diam:  3.05 cm RV Mid diam:    2.45 cm RV S prime:     13.10 cm/s TAPSE (M-mode): 2.1 cm LEFT ATRIUM           Index        RIGHT ATRIUM           Index LA diam:      2.70 cm 1.46 cm/m   RA Area:     10.70 cm LA Vol (A2C): 37.4 ml 20.19 ml/m  RA Volume:   22.50 ml  12.15 ml/m LA Vol (A4C): 17.2 ml 9.28 ml/m  AORTIC VALVE LVOT Vmax:   87.40 cm/s LVOT Vmean:  55.800 cm/s LVOT VTI:    0.194 m  AORTA Ao Root diam: 3.60 cm MITRAL VALVE MV Area (PHT): 3.53 cm    SHUNTS MV Decel Time:  215 msec    Systemic VTI:  0.19 m MV E velocity: 51.70 cm/s  Systemic Diam: 2.10 cm MV A velocity: 76.40 cm/s MV E/A ratio:  0.68 Redell Shallow MD Electronically signed by Redell Shallow MD Signature Date/Time: 12/07/2023/2:45:24 PM    Final    MR BRAIN WO CONTRAST Result Date: 12/07/2023 EXAM: MRI BRAIN WITHOUT CONTRAST 12/07/2023 10:21:06 AM TECHNIQUE: Multiplanar multisequence MRI of the head/brain was performed without the administration of intravenous contrast. COMPARISON: Head CT 12/06/2023. CLINICAL HISTORY: Stroke, follow up. Acute onset of right lower extremity numbness and weakness. FINDINGS: BRAIN AND VENTRICLES: There are a few small acute infarcts involving the mid left cingulate gyrus and posterior left frontal lobe (distal left ACA territory). Scattered small T2 hyperintensities in the cerebral white matter bilaterally are nonspecific but compatible with mild chronic small vessel ischemic disease. There is a small chronic right cerebellar infarct. No intracranial hemorrhage, mass, midline shift, hydrocephalus, or extra axial fluid collection is identified. Major intracranial vascular flow voids are preserved. Bilateral cataract extraction. ORBITS: No acute abnormality. SINUSES AND MASTOIDS: No acute abnormality. BONES AND SOFT TISSUES: Normal marrow signal. No acute soft tissue abnormality. IMPRESSION: 1. Small acute distal left ACA infarcts. 2. Mild chronic small vessel ischemic disease. 3. Small chronic right cerebellar infarct. Electronically signed by: Dasie Hamburg MD 12/07/2023 10:43 AM EST RP Workstation: [id number]   CT HEAD CODE STROKE WO CONTRAST (LKW 0-4.5h, LVO 0-24h) Result Date: 12/06/2023 EXAM: CT HEAD WITHOUT CONTRAST 12/06/2023 09:21:11 PM TECHNIQUE: CT of the head was performed without the administration of intravenous contrast. Automated exposure control, iterative reconstruction, and/or weight based adjustment of the mA/kV was utilized to reduce the radiation dose to as low as  reasonably achievable. COMPARISON: None available. CLINICAL HISTORY: Neuro deficit, acute, stroke suspected. FINDINGS: BRAIN AND VENTRICLES: No acute hemorrhage. No evidence of acute infarct. No hydrocephalus. No extra-axial collection. No mass effect or midline shift. [place] Stroke Program Early CT Score (ASPECTS): Ganglionic (caudate, internal capsule, lentiform nucleus, insula, M1-M3): 7. Supraganglionic (M4-M6): 3. Total: 10. ORBITS: No acute abnormality. SINUSES: No acute abnormality. SOFT TISSUES AND SKULL: No acute soft tissue abnormality. No skull fracture. IMPRESSION: 1. No acute intracranial  abnormality. 2. ASPECTS score is 10. 3. Findings were communicated to Dr. Camellia Shark at 09:25 pm on 12/06/2023. Electronically signed by: Franky Stanford MD 12/06/2023 09:26 PM EST RP Workstation: [id number]      HISTORY OF PRESENT ILLNESS 68 y.o. male with history of smoker, CAD p/w acute onset of RLE numbness and weakness, from the hip to his toes. At the time of symptom onset, he had some CP that improved with ASA and nitroglycerin , but which did not help with the leg weakness. CTH negative. TNK administered 12/5. NIH on Admission 2.  MRI revealed left ACA stroke. Evaluated by therapy, home discharge.  HOSPITAL COURSE Stroke:  left ACA territory scattered small infarcts, etiology: Likely cardioembolic Code Stroke CT head No acute abnormality. ASPECTS 10.    CTA head & neck no LVO or significant stenosis Repeat CTH 24hour post-TNK no hemorrhage MRI  Small acute distal left ACA infarcts, mild chronic small vessel disease. Small chronic right cerebellar infarct  2D Echo: EF 50-55%, Mild left LVH, Apical hypokinesis, no apical thrombus  Recommend 30-day cardiac event monitoring as outpatient to rule out A-fib LDL 103 HgbA1c 6.0 UDS opiates VTE prophylaxis - lovenox  aspirin  81 mg daily and clopidogrel  75 mg daily prior to admission, now on aspirin  81 mg daily and Brilinta  (ticagrelor ) 90 mg bid for 4  weeks and then back to home ASA and plavix .  Patient states he has been off Plavix  for a few days 2-3 times in the past couple of months for colonoscopy and dental procedures.  Therapy recommendations: no follow-up Disposition: home 12/6   Hypertension Home meds: Losartan  25 mg daily, Coreg  6.25mg  BID Stable BP goal: normotensive    Hyperlipidemia Home meds: Lipitor  80 mg (noncompliant) resumed home Lipitor  in hospital LDL 103, goal < 70 Continue statin at discharge   Tobacco Abuse Patient is a current smoker       Ready to quit? Yes Nicotine  replacement therapy provided  Other stroke risk factors Advanced age CAD  DISCHARGE EXAM Blood pressure 105/79, pulse 76, temperature 98.2 F (36.8 C), temperature source Oral, resp. rate 12, height 5' 8 (1.727 m), weight 72 kg, SpO2 95%.  PHYSICAL EXAM General:  Alert, well-nourished, well-developed patient in no acute distress Psych:  Mood and affect appropriate for situation CV: Regular rate and rhythm on monitor Respiratory:  Regular, unlabored respirations on room air GI: Abdomen soft and nontender   NEURO:  Mental Status: AA&Ox3, patient is able to give clear and coherent history Speech/Language: speech is without dysarthria or aphasia.  Naming, repetition, fluency, and comprehension intact.   Cranial Nerves:  II: PERRL. Visual fields full.  III, IV, VI: EOMI. Eyelids elevate symmetrically.  V: Sensation is intact to light touch and symmetrical to face.  VII: No facial droop VIII: hearing intact to voice. IX, X: Palate elevates symmetrically. Phonation is normal.  KP:Dynloizm shrug 5/5. XII: tongue is midline without fasciculations. Motor: No drifts in any extremity.  RUE: slight decreased grip Tone: is normal and bulk is normal Sensation- Intact to light touch bilaterally. Extinction absent to light touch to DSS.   Coordination: FTN intact bilaterally. Decreased fine motor movements R hand.  Gait- deferred   Most  Recent NIH: 0  Discharge Diet       Diet   Diet Heart Room service appropriate? Yes; Fluid consistency: Thin   liquids  DISCHARGE PLAN Disposition:  home aspirin  81 mg daily and Brilinta  (ticagrelor ) 90 mg bid for secondary stroke prevention for 4 weeks,  then Aspirin  and Plavix  daily.  Ongoing stroke risk factor control by Primary Care Physician at time of discharge Follow-up PCP [facility] in 2 weeks. Follow-up in [facility] in 4-6 weeks, office to schedule an appointment.   35 minutes were spent preparing discharge.  Rocky JAYSON Likes, DNP [facility]  ATTENDING NOTE: I reviewed above note and agree with the assessment and plan. Pt was seen and examined.   No family at bedside.  RN at bedside.  Patient stated that he felt good today, much improved from yesterday.  Neuroexam essentially intact.  Stroke likely large/small vessel disease versus cardioembolic source.  Recommend 30-day cardiac event monitor as outpatient.  Continue aspirin  and Brilinta  for 4 weeks and then back to aspirin  and Plavix .  Continue home Lipitor .  Smoking cessation education provided.  PT and OT no recommendation.  Patient discharging good condition, follow-up with [facility].  For detailed assessment and plan, please refer to above as I have made changes wherever appropriate.   Ary Cummins, MD PhD Stroke Neurology 12/08/2023 5:51 PM

## 2023-12-08 NOTE — Evaluation (Signed)
 Speech Language Pathology Evaluation Patient Details Name: Anthony Newman MRN: [id number] DOB: 06/04/1955 Today's Date: 12/08/2023 Time: 9158-9149 SLP Time Calculation (min) (ACUTE ONLY): 9 min  Problem List:  Patient Active Problem List   Diagnosis Date Noted   Acute ischemic stroke (HCC) 12/07/2023   Stroke (cerebrum) (HCC) 12/07/2023   Appendicitis 06/05/2019   Acute appendicitis 06/05/2019   Past Medical History:  Past Medical History:  Diagnosis Date   Coronary artery disease    Past Surgical History:  Past Surgical History:  Procedure Laterality Date   LAPAROSCOPIC APPENDECTOMY N/A 06/05/2019   Procedure: APPENDECTOMY LAPAROSCOPIC;  Surgeon: Kinsinger, Herlene Righter, MD;  Location: [facility] OR;  Service: General;  Laterality: N/A;   HPI:  In with RLE numbness and weakness.  PMH:  CAD and smoking.    CSE 11/21/2017 with recommendation for regular/thin and no ST follow up.  Cognitive eval also completed with deficits noted for delayed recall, attention, orientation to time, convergent reasoning and judgement.  MRI brain showing small acute distal left ACA infarcts, mild chronic small vessell ischemic disease and small chronic right cerebellar infarct.   Assessment / Plan / Recommendation Clinical Impression  Cognitive linguistic evaluation and motor speech screen were completed.  RN was reporting no obvious issues.  The patient did not endorse any trouble with speech, language or cognition.  Cranial nerve exam was completed and unremarkable. Lingual, labial, facial and jaw range of motion and strength appeared to be adequate.  Facial sensation appeared to be intact and he did not endorse a difference in sensation between the right and left side of his face.  Speech was clear and easy to understand. No discernible dysarthria or apraxia were noted.   He completed the Mini Mental State Exam achieving a score of 28/30.  Mild issues noted for delayed recall which patient reported as baseline.  He  was fully oriented to person, place, time and situation.  He had good immediate recall of three novel words, however, given a short delay he was only able to independently recall 1/3 words. Use of semantic cue facilitated recall of all three words.  Attention skills appeared to be intact. Language skills appeard to be grossly intact.  He was able to name objects, repeat a short phrase, follow a 3 step command, read/comprehend a sentence, write a sentence and copy a design.  Informally he was able to provide logical solutions to simple problems and accurately complete the clock drawing task.  Given results of this evaluation ST follow up is not indicated.    SLP Assessment  SLP Recommendation/Assessment: Patient does not need any further Speech Language Pathology Services SLP Visit Diagnosis: Cognitive communication deficit (R41.841)     Assistance Recommended at Discharge  None           SLP Evaluation Cognition  Overall Cognitive Status: Within Functional Limits for tasks assessed Arousal/Alertness: Awake/alert Orientation Level: Oriented X4 Attention: Sustained Sustained Attention: Appears intact Memory: Appears intact Awareness: Appears intact Problem Solving: Appears intact Safety/Judgment: Appears intact       Comprehension  Auditory Comprehension Overall Auditory Comprehension: Appears within functional limits for tasks assessed Commands: Within Functional Limits Conversation: Complex Reading Comprehension Reading Status: Within funtional limits    Expression Expression Primary Mode of Expression: Verbal Verbal Expression Overall Verbal Expression: Appears within functional limits for tasks assessed Initiation: No impairment Automatic Speech: Name;Social Response Level of Generative/Spontaneous Verbalization: Conversation Repetition: No impairment Naming: No impairment Pragmatics: No impairment Non-Verbal Means of Communication: Not applicable  Written  Expression Dominant Hand: Right Written Expression: Within Functional Limits   Oral / Motor  Oral Motor/Sensory Function Overall Oral Motor/Sensory Function: Within functional limits Motor Speech Overall Motor Speech: Appears within functional limits for tasks assessed           Eleanor Eagles, MA, CCC-SLP Acute Rehab SLP [phone]  Eleanor LOISE Eagles 12/08/2023, 9:08 AM

## 2023-12-08 NOTE — Progress Notes (Incomplete)
 STROKE TEAM PROGRESS NOTE    SIGNIFICANT HOSPITAL EVENTS  12/4: RLE numbness and weakness Code Stroke  CTH negative.   Home medications include Plavix  and atorvastatin .   TNK given @ 2205  12/5: MRI shows small acute distal left ACA infarcts.  CT/CTA negative for hemorrhage, no LVO   INTERIM HISTORY/SUBJECTIVE  *** dc?  Patient sitting up in bed. No family ay bedside. RN at bedside.  Right facial droop on exam with decreased right hand grip, and decreased fine motor movements in right hand.    OBJECTIVE  CBC    Component Value Date/Time   WBC 7.6 12/08/2023 0434   RBC 4.58 12/08/2023 0434   HGB 14.0 12/08/2023 0434   HCT 40.9 12/08/2023 0434   PLT 214 12/08/2023 0434   MCV 89.3 12/08/2023 0434   MCH 30.6 12/08/2023 0434   MCHC 34.2 12/08/2023 0434   RDW 14.5 12/08/2023 0434   LYMPHSABS 2.8 12/06/2023 2109   MONOABS 0.8 12/06/2023 2109   EOSABS 0.1 12/06/2023 2109   BASOSABS 0.1 12/06/2023 2109    BMET    Component Value Date/Time   NA 135 12/08/2023 0434   K 3.4 (L) 12/08/2023 0434   CL 101 12/08/2023 0434   CO2 24 12/08/2023 0434   GLUCOSE 151 (H) 12/08/2023 0434   BUN 8 12/08/2023 0434   CREATININE 0.92 12/08/2023 0434   CALCIUM  8.6 (L) 12/08/2023 0434   GFRNONAA >60 12/08/2023 0434    IMAGING past 24 hours CT ANGIO HEAD W OR WO CONTRAST Result Date: 12/07/2023 EXAM: CTA Head and Neck with Intravenous Contrast. CT Head without Contrast. CLINICAL HISTORY: Stroke suspected (Ped 0-17y) TECHNIQUE: Axial CTA images of the head and neck performed with intravenous contrast. MIP reconstructed images were created and reviewed. Axial computed tomography images of the head/brain performed without intravenous contrast. Note: Per PQRS, the description of internal carotid artery percent stenosis, including 0 percent or normal exam, is based on [employer] American Symptomatic Carotid Endarterectomy Trial (NASCET) criteria. Dose reduction technique was used including one or more  of the following: automated exposure control, adjustment of mA and kV according to patient size, and/or iterative reconstruction. CONTRAST: 80mL Omnipaque  IV COMPARISON: MRI Head 12/07/23 FINDINGS: CT HEAD: BRAIN: No acute intraparenchymal hemorrhage. No mass lesion. Known left ACA territory infarcts were better characterized on same day MRI. No midline shift or extra-axial collection. VENTRICLES: No hydrocephalus. ORBITS: The orbits are unremarkable. SINUSES AND MASTOIDS: The paranasal sinuses and mastoid air cells are clear. CTA NECK: COMMON CAROTID ARTERIES: No significant stenosis. No dissection or occlusion. INTERNAL CAROTID ARTERIES: No stenosis by NASCET criteria. No dissection or occlusion. VERTEBRAL ARTERIES: No significant stenosis. No dissection or occlusion. CTA HEAD: ANTERIOR CEREBRAL ARTERIES: Hypoplastic right A1 ACA. No significant stenosis. No occlusion. No aneurysm. MIDDLE CEREBRAL ARTERIES: No significant stenosis. No occlusion. No aneurysm. POSTERIOR CEREBRAL ARTERIES: Bilateral fetal type PCAs with small vertebrobasilar system, anatomic variant. No significant stenosis. No occlusion. No aneurysm. BASILAR ARTERY: No significant stenosis. No occlusion. No aneurysm. OTHER: SOFT TISSUES: No acute finding. No masses or lymphadenopathy. BONES: No acute osseous abnormality. IMPRESSION: 1. No large vessel occlusion or significant stenosis. Electronically signed by: Gilmore Molt MD 12/07/2023 11:41 PM EST RP Workstation: [id number]   ECHOCARDIOGRAM LIMITED Result Date: 12/07/2023    ECHOCARDIOGRAM LIMITED REPORT   Patient Name:   ZAMARIAN SCARANO Date of Exam: 12/07/2023 Medical Rec #:  [id number]      Height:       68.0 in Accession #:  [id number]     Weight:       158.7 lb Date of Birth:  06-10-1955     BSA:          1.853 m Patient Age:    68 years       BP:           132/92 mmHg Patient Gender: M              HR:           66 bpm. Exam Location:  Inpatient Procedure: Limited Echo and Intracardiac  Opacification Agent Indications:    Stroke  History:        Patient has prior history of Echocardiogram examinations, most                 recent 12/07/2023. CAD, Stroke; Risk Factors:Current Smoker.  Sonographer:    Juliene Rucks Referring Phys: [id number] PRAMOD S SETHI IMPRESSIONS  1. Limited study with definity  to R/O apical thrombus; apical swirling noted suggestive of low flow but no obvious thrombus. FINDINGS  Left Ventricle: Definity  contrast agent was given IV to delineate the left ventricular endocardial borders. Additional Comments: Limited study with definity  to R/O apical thrombus; apical swirling noted suggestive of low flow but no obvious thrombus.  Redell Shallow MD Electronically signed by Redell Shallow MD Signature Date/Time: 12/07/2023/5:25:39 PM    Final    ECHOCARDIOGRAM COMPLETE Result Date: 12/07/2023    ECHOCARDIOGRAM REPORT   Patient Name:   NOAH LEMBKE Date of Exam: 12/07/2023 Medical Rec #:  [id number]      Height:       68.0 in Accession #:    [id number]     Weight:       158.7 lb Date of Birth:  10-Dec-1955     BSA:          1.853 m Patient Age:    68 years       BP:           120/82 mmHg Patient Gender: M              HR:           68 bpm. Exam Location:  Inpatient Procedure: 2D Echo, Cardiac Doppler and Color Doppler (Both Spectral and Color            Flow Doppler were utilized during procedure). Indications:    Stroke  History:        Patient has no prior history of Echocardiogram examinations.                 CAD, Stroke; Risk Factors:Current Smoker.  Sonographer:    RUCKS, ADAM Referring Phys: [id number] ERIC LINDZEN IMPRESSIONS  1. Apical hypokinesis with overall low normal LV function. Suggest FU study with definity  or cardiac MRI to R/O apical thrombus.  2. Left ventricular ejection fraction, by estimation, is 50 to 55%. The left ventricle has low normal function. The left ventricle demonstrates regional wall motion abnormalities (see scoring diagram/findings for description). There is  mild left ventricular hypertrophy. Left ventricular diastolic parameters are consistent with Grade I diastolic dysfunction (impaired relaxation).  3. Right ventricular systolic function is normal. The right ventricular size is normal.  4. The mitral valve is normal in structure. No evidence of mitral valve regurgitation. No evidence of mitral stenosis.  5. The aortic valve is tricuspid. Aortic valve regurgitation is not visualized. No aortic stenosis is present.  6. The inferior vena cava is  normal in size with greater than 50% respiratory variability, suggesting right atrial pressure of 3 mmHg. Comparison(s): No prior Echocardiogram. FINDINGS  Left Ventricle: Left ventricular ejection fraction, by estimation, is 50 to 55%. The left ventricle has low normal function. The left ventricle demonstrates regional wall motion abnormalities. The left ventricular internal cavity size was normal in size. There is mild left ventricular hypertrophy. Left ventricular diastolic parameters are consistent with Grade I diastolic dysfunction (impaired relaxation). Right Ventricle: The right ventricular size is normal. Right ventricular systolic function is normal. Left Atrium: Left atrial size was normal in size. Right Atrium: Right atrial size was normal in size. Pericardium: There is no evidence of pericardial effusion. Mitral Valve: The mitral valve is normal in structure. No evidence of mitral valve regurgitation. No evidence of mitral valve stenosis. Tricuspid Valve: The tricuspid valve is normal in structure. Tricuspid valve regurgitation is trivial. No evidence of tricuspid stenosis. Aortic Valve: The aortic valve is tricuspid. Aortic valve regurgitation is not visualized. No aortic stenosis is present. Pulmonic Valve: The pulmonic valve was normal in structure. Pulmonic valve regurgitation is not visualized. No evidence of pulmonic stenosis. Aorta: The aortic root is normal in size and structure. Venous: The inferior vena  cava is normal in size with greater than 50% respiratory variability, suggesting right atrial pressure of 3 mmHg. IAS/Shunts: No atrial level shunt detected by color flow Doppler. Additional Comments: Apical hypokinesis with overall low normal LV function. Suggest FU study with definity  or cardiac MRI to R/O apical thrombus.  LEFT VENTRICLE PLAX 2D LVIDd:         3.60 cm     Diastology LVIDs:         2.70 cm     LV e' medial:    6.20 cm/s LV PW:         1.30 cm     LV E/e' medial:  8.3 LV IVS:        1.20 cm     LV e' lateral:   8.38 cm/s LVOT diam:     2.10 cm     LV E/e' lateral: 6.2 LV SV:         67 LV SV Index:   36 LVOT Area:     3.46 cm  LV Volumes (MOD) LV vol d, MOD A2C: 54.6 ml LV vol d, MOD A4C: 94.7 ml LV vol s, MOD A2C: 28.7 ml LV vol s, MOD A4C: 48.1 ml LV SV MOD A2C:     25.9 ml LV SV MOD A4C:     94.7 ml LV SV MOD BP:      34.4 ml RIGHT VENTRICLE RV Basal diam:  3.05 cm RV Mid diam:    2.45 cm RV S prime:     13.10 cm/s TAPSE (M-mode): 2.1 cm LEFT ATRIUM           Index        RIGHT ATRIUM           Index LA diam:      2.70 cm 1.46 cm/m   RA Area:     10.70 cm LA Vol (A2C): 37.4 ml 20.19 ml/m  RA Volume:   22.50 ml  12.15 ml/m LA Vol (A4C): 17.2 ml 9.28 ml/m  AORTIC VALVE LVOT Vmax:   87.40 cm/s LVOT Vmean:  55.800 cm/s LVOT VTI:    0.194 m  AORTA Ao Root diam: 3.60 cm MITRAL VALVE MV Area (PHT): 3.53 cm    SHUNTS MV Decel  Time: 215 msec    Systemic VTI:  0.19 m MV E velocity: 51.70 cm/s  Systemic Diam: 2.10 cm MV A velocity: 76.40 cm/s MV E/A ratio:  0.68 Redell Shallow MD Electronically signed by Redell Shallow MD Signature Date/Time: 12/07/2023/2:45:24 PM    Final    MR BRAIN WO CONTRAST Result Date: 12/07/2023 EXAM: MRI BRAIN WITHOUT CONTRAST 12/07/2023 10:21:06 AM TECHNIQUE: Multiplanar multisequence MRI of the head/brain was performed without the administration of intravenous contrast. COMPARISON: Head CT 12/06/2023. CLINICAL HISTORY: Stroke, follow up. Acute onset of right lower  extremity numbness and weakness. FINDINGS: BRAIN AND VENTRICLES: There are a few small acute infarcts involving the mid left cingulate gyrus and posterior left frontal lobe (distal left ACA territory). Scattered small T2 hyperintensities in the cerebral white matter bilaterally are nonspecific but compatible with mild chronic small vessel ischemic disease. There is a small chronic right cerebellar infarct. No intracranial hemorrhage, mass, midline shift, hydrocephalus, or extra axial fluid collection is identified. Major intracranial vascular flow voids are preserved. Bilateral cataract extraction. ORBITS: No acute abnormality. SINUSES AND MASTOIDS: No acute abnormality. BONES AND SOFT TISSUES: Normal marrow signal. No acute soft tissue abnormality. IMPRESSION: 1. Small acute distal left ACA infarcts. 2. Mild chronic small vessel ischemic disease. 3. Small chronic right cerebellar infarct. Electronically signed by: Dasie Hamburg MD 12/07/2023 10:43 AM EST RP Workstation: [id number]    Vitals:   12/08/23 0500 12/08/23 0600 12/08/23 0800 12/08/23 0829  BP: 132/70 123/72 118/82   Pulse: 67 (!) 56 60 68  Resp: 16 12 11 16   Temp:    98.8 F (37.1 C)  TempSrc:    Oral  SpO2: 97% 97% 95% 94%  Weight:      Height:        PHYSICAL EXAM General:  Alert, well-nourished, well-developed patient in no acute distress Psych:  Mood and affect appropriate for situation CV: Regular rate and rhythm on monitor Respiratory:  Regular, unlabored respirations on room air GI: Abdomen soft and nontender  NEURO:  Mental Status: AA&Ox3, patient is able to give clear and coherent history Speech/Language: speech is without dysarthria or aphasia.  Naming, repetition, fluency, and comprehension intact.  Cranial Nerves:  II: PERRL. Visual fields full.  III, IV, VI: EOMI. Eyelids elevate symmetrically.  V: Sensation is intact to light touch and symmetrical to face.  VII: Mild right facial droop VIII: hearing intact to  voice. IX, X: Palate elevates symmetrically. Phonation is normal.  KP:Dynloizm shrug 5/5. XII: tongue is midline without fasciculations. Motor: No drifts in any extremity.  RUE: decreased grip Tone: is normal and bulk is normal Sensation- Intact to light touch bilaterally. Extinction absent to light touch to DSS.   Coordination: FTN intact bilaterally. Decreased fine motor movements R hand.  Gait- deferred  Most Recent NIH: 1.    ASSESSMENT/PLAN  Mr. JEBEDIAH MACRAE is a 68 y.o. male with history of smoker, CAD NIH on Admission 2.  Acute Ischemic Infarcts:  left ACA territory Etiology:  small vessel disease  Code Stroke CT head No acute abnormality. ASPECTS 10.    CTA head & neck: scheduled for tonight Repeat CTH 24hour post-TNK: scheduled for tonight MRI   Small acute distal left ACA infarcts Mild chronic small vessel disease Small chronic right cerebellar infarct  2D Echo PENDING LDL 103 HgbA1c 6.0 VTE prophylaxis - lovenox  aspirin  81 mg daily and clopidogrel  75 mg daily prior to admission, now on aspirin  81 mg daily and Brilinta  (ticagrelor ) 90 mg bid  for 4 weeks and then aspirin  alone. Also discussed Librexia trial with patient today, pending patient decision.  Therapy recommendations:  Pending Disposition:  pending  Hypertension Home meds: Losartan  25 mg daily Stable Blood Pressure Goal: BP less than 180/105, can gradually normalize as we are 24hours out of TNK administration.   Hyperlipidemia Home meds: Lipitor  80 mg, resumed in hospital LDL 103, goal < 70 Added zetia Continue statin at discharge  Tobacco Abuse Patient is a current smoker       Ready to quit? Yes Nicotine  replacement therapy provided  Other Active Problems   Hospital day # 1   Pt seen by Neuro NP/APP with MD. Note/plan to be edited by MD as needed.    Rocky JAYSON Likes, DNP [facility] Please use AMION for contact information & EPIC for messaging.     To contact Stroke  Continuity provider, please refer to [email or web address]. After hours, contact General Neurology

## 2023-12-08 NOTE — Progress Notes (Signed)
Pt d/c to home by car with family. Assessment stable. D/C instructions reviewed. All questions answered. 

## 2023-12-08 NOTE — Progress Notes (Signed)
 Physical Therapy Treatment Patient Details Name: Anthony Newman MRN: [id number] DOB: 10-14-1955 Today's Date: 12/08/2023   History of Present Illness 68 yo M adm 12/5  w/ R side weakness and numbness given TNK MRI (+) Small acute distal L ACA infarct , small chronic R cerebellar infarct NIH 2 CT (negative) PMH CAD smoker    PT Comments  Patient seen for stair training with no difficulties with RLE on stairs x 10. No gait deficits noted (and scored 24/24 on DGI with OT earlier. Scored 56/56 on Berg Balance Assessment. No further PT indicated.     If plan is discharge home, recommend the following:     Can travel by private vehicle        Equipment Recommendations  None recommended by PT    Recommendations for Other Services       Precautions / Restrictions Precautions Recall of Precautions/Restrictions: Intact Restrictions Weight Bearing Restrictions Per Provider Order: No     Mobility  Bed Mobility Overal bed mobility: Independent Bed Mobility: Supine to Sit, Sit to Supine     Supine to sit: Independent          Transfers Overall transfer level: Independent Equipment used: None Transfers: Sit to/from Stand Sit to Stand: Independent                Ambulation/Gait Ambulation/Gait assistance: Independent Gait Distance (Feet): 100 Feet Assistive device: None Gait Pattern/deviations: WFL(Within Functional Limits)       General Gait Details: no deficits noted; scored 24/24 on DGI with OT today   Stairs Stairs: Yes Stairs assistance: Modified independent (Device/Increase time) Stair Management: One rail Left, Alternating pattern, Forwards Number of Stairs: 10 General stair comments: reported no sense of weakness in his RLE today   Wheelchair Mobility     Tilt Bed    Modified Rankin (Stroke Patients Only) Modified Rankin (Stroke Patients Only) Pre-Morbid Rankin Score: No symptoms Modified Rankin: No symptoms     Balance Overall balance  assessment: Independent                               Standardized Balance Assessment Standardized Balance Assessment : Berg Balance Test Berg Balance Test Sit to Stand: Able to stand without using hands and stabilize independently Standing Unsupported: Able to stand safely 2 minutes Sitting with Back Unsupported but Feet Supported on Floor or Stool: Able to sit safely and securely 2 minutes Stand to Sit: Sits safely with minimal use of hands Transfers: Able to transfer safely, minor use of hands Standing Unsupported with Eyes Closed: Able to stand 10 seconds safely Standing Ubsupported with Feet Together: Able to place feet together independently and stand 1 minute safely From Standing, Reach Forward with Outstretched Arm: Can reach confidently >25 cm (10) From Standing Position, Pick up Object from Floor: Able to pick up shoe safely and easily From Standing Position, Turn to Look Behind Over each Shoulder: Looks behind from both sides and weight shifts well Turn 360 Degrees: Able to turn 360 degrees safely in 4 seconds or less Standing Unsupported, Alternately Place Feet on Step/Stool: Able to stand independently and safely and complete 8 steps in 20 seconds Standing Unsupported, One Foot in Front: Able to place foot tandem independently and hold 30 seconds Standing on One Leg: Able to lift leg independently and hold > 10 seconds Total Score: 56        Communication Communication Communication: No apparent difficulties  Cognition Arousal: Alert Behavior During Therapy: WFL for tasks assessed/performed   PT - Cognitive impairments: No apparent impairments                         Following commands: Intact      Cueing Cueing Techniques: Verbal cues  Exercises      General Comments        Pertinent Vitals/Pain Pain Assessment Pain Assessment: No/denies pain Faces Pain Scale: No hurt    Home Living Family/patient expects to be discharged to::  Private residence Living Arrangements: Other relatives (sister) Available Help at Discharge: Family;Available PRN/intermittently Type of Home: House Home Access: Stairs to enter Entrance Stairs-Rails: Right;Left Entrance Stairs-Number of Steps: 2   Home Layout: One level Home Equipment: Agricultural Consultant (2 wheels);Shower seat;Cane - single point      Prior Function            PT Goals (current goals can now be found in the care plan section) Acute Rehab PT Goals Patient Stated Goal: Independent PT Goal Formulation: With patient Time For Goal Achievement: 12/10/23 Potential to Achieve Goals: Good Progress towards PT goals: Goals met/education completed, patient discharged from PT    Frequency           PT Plan      Co-evaluation              AM-PAC PT 6 Clicks Mobility   Outcome Measure  Help needed turning from your back to your side while in a flat bed without using bedrails?: None Help needed moving from lying on your back to sitting on the side of a flat bed without using bedrails?: None Help needed moving to and from a bed to a chair (including a wheelchair)?: None Help needed standing up from a chair using your arms (e.g., wheelchair or bedside chair)?: None Help needed to walk in hospital room?: None Help needed climbing 3-5 steps with a railing? : None 6 Click Score: 24    End of Session Equipment Utilized During Treatment: Gait belt Activity Tolerance: Patient tolerated treatment well Patient left: with call bell/phone within reach;in bed Nurse Communication: Mobility status;Other (comment) (no f/u PT needs) PT Visit Diagnosis: Other abnormalities of gait and mobility (R26.89);Other symptoms and signs involving the nervous system (R29.898)     Time: 8590-8582 PT Time Calculation (min) (ACUTE ONLY): 8 min  Charges:    $Gait Training: 8-22 mins PT General Charges $$ ACUTE PT VISIT: 1 Visit                      Macario RAMAN, PT Acute  Rehabilitation Services  Office [phone]    Macario SHAUNNA Soja 12/08/2023, 2:25 PM

## 2023-12-08 NOTE — Evaluation (Signed)
 Occupational Therapy Evaluation Patient Details Name: Anthony Newman MRN: [id number] DOB: Mar 30, 1955 Today's Date: 12/08/2023   History of Present Illness   68 yo M adm 12/5  w/ R side weakness and numbness given TNK MRI (+) Small acute distal L ACA infarct , small chronic R cerebellar infarct NIH 2 CT (negative) PMH CAD smoker     Clinical Impressions PTA pt lives independently with his sister and enjoys playing golf. Pt appears close to baseline. Independently completing ADL tasks, able to retrieve items from floor without LOB and scored a 24 on the DGI, which indicates balance WFL. Educated pt on warning signs/symptoms using Be[employer]. Pt/family verbalized understanding. Apparent difficulty with working memory, however pt states that is normal for him. Discussed importance of smoking cessation - nsg made aware that pt is interested in information. No further OT needed. OT signing off.      If plan is discharge home, recommend the following:   Assist for transportation     Functional Status Assessment   Patient has not had a recent decline in their functional status     Equipment Recommendations   None recommended by OT     Recommendations for Other Services         Precautions/Restrictions   Precautions Precautions:  (lower fall risk) Recall of Precautions/Restrictions: Intact Restrictions Weight Bearing Restrictions Per Provider Order: No     Mobility Bed Mobility Overal bed mobility: Independent                  Transfers Overall transfer level: Independent                        Balance Overall balance assessment: Modified Independent                               Standardized Balance Assessment Standardized Balance Assessment : Dynamic Gait Index   Dynamic Gait Index Level Surface: Normal Change in Gait Speed: Normal Gait with Horizontal Head Turns: Normal Gait with Vertical Head Turns: Normal Gait and Pivot Turn:  Normal Step Over Obstacle: Normal Step Around Obstacles: Normal Steps: Normal Total Score: 24     ADL either performed or assessed with clinical judgement   ADL Overall ADL's : At baseline                                       General ADL Comments: Pt independently goingto the bathroom and completing grooming tasks     Vision Baseline Vision/History: 1 Wears glasses Ability to See in Adequate Light: 0 Adequate Patient Visual Report: Blurring of vision Vision Assessment?: Yes;No apparent visual deficits;Wears glasses for reading     Perception         Praxis         Pertinent Vitals/Pain Pain Assessment Pain Assessment: No/denies pain Faces Pain Scale: No hurt     Extremity/Trunk Assessment Upper Extremity Assessment Upper Extremity Assessment: Overall WFL for tasks assessed   Lower Extremity Assessment Lower Extremity Assessment: Overall WFL for tasks assessed RLE Sensation:  (all sensation has returned) RLE Coordination:  (close to baseline)   Cervical / Trunk Assessment Cervical / Trunk Assessment: Normal   Communication Communication Communication: No apparent difficulties   Cognition Arousal: Alert Behavior During Therapy: WFL for tasks assessed/performed Cognition: History of cognitive impairments (memory  deficits at baseline)                               Following commands: Intact       Cueing  General Comments   Cueing Techniques: Verbal cues      Exercises     Shoulder Instructions      Home Living Family/patient expects to be discharged to:: Private residence Living Arrangements: Other relatives (sister) Available Help at Discharge: Family;Available PRN/intermittently Type of Home: House Home Access: Stairs to enter [employer] of Steps: 2 Entrance Stairs-Rails: Right;Left Home Layout: One level     Bathroom Shower/Tub: Chief Strategy Officer: Handicapped height Bathroom  Accessibility: Yes How Accessible: Accessible via walker Home Equipment: Rolling Walker (2 wheels);Shower seat;Cane - single point      Lives With: Family (Sister)    Prior Functioning/Environment Prior Level of Function : Independent/Modified Independent;Driving (retired)                    OT Problem List:     OT Treatment/Interventions:        OT Goals(Current goals can be found in the care plan section)   Acute Rehab OT Goals Patient Stated Goal: home OT Goal Formulation: All assessment and education complete, DC therapy   OT Frequency:       Co-evaluation              AM-PAC OT 6 Clicks Daily Activity     Outcome Measure Help from another person eating meals?: None Help from another person taking care of personal grooming?: None Help from another person toileting, which includes using toliet, bedpan, or urinal?: None Help from another person bathing (including washing, rinsing, drying)?: None Help from another person to put on and taking off regular upper body clothing?: None Help from another person to put on and taking off regular lower body clothing?: None 6 Click Score: 24   End of Session Equipment Utilized During Treatment: Gait belt Nurse Communication: Mobility status  Activity Tolerance: Patient tolerated treatment well Patient left: in bed;with call bell/phone within reach;with family/visitor present  OT Visit Diagnosis: Unsteadiness on feet (R26.81)                Time: 8848-8784 OT Time Calculation (min): 24 min Charges:  OT General Charges $OT Visit: 1 Visit OT Evaluation $OT Eval Low Complexity: 1 Low OT Treatments $Self Care/Home Management : 8-22 mins  Kreg Sink, OT/L   Acute OT Clinical Specialist Acute Rehabilitation Services Pager [phone] Office [phone]   [facility] 12/08/2023, 12:19 PM

## 2023-12-11 ENCOUNTER — Other Ambulatory Visit: Payer: Self-pay | Admitting: Cardiology

## 2023-12-11 ENCOUNTER — Encounter: Payer: Self-pay | Admitting: *Deleted

## 2023-12-11 ENCOUNTER — Telehealth: Payer: Self-pay

## 2023-12-11 DIAGNOSIS — I639 Cerebral infarction, unspecified: Secondary | ICD-10-CM

## 2023-12-11 NOTE — Progress Notes (Signed)
 Patient enrolled for [facility] [employer] to ship a 30 day cardiac event monitor to his address on file.  Dr. Georganna Archer to read.

## 2023-12-11 NOTE — Transitions of Care (Post Inpatient/ED Visit) (Signed)
 Stroke Discharge Follow-up   12/11/2023 Name:  KYIAN OBST MRN:  [id number] DOB:  03-Sep-1955  Subjective: Anthony Newman is a 68 y.o. year old male who is a primary care patient of [facility] An Emmi alert was received indicating patient responded to questions: Questions/problems with meds?. I reached out by phone to follow up on the alert and spoke to Patient.  Patient has questions about how long he will be off Spirolactone.  I reviewed notes and suggested patient get in contact with his PCP and cardiologist.   Reviewed medications and patient is taking his medications as prescribed. Reviewed signs of stroke and when to call 911.  Patient is not currently having any symptoms.  Also reviewed with patient the importance of smoking cessation. Reports that he is wearing the patches and has not smoked since the stroke.   Care Management Interventions: called patient and answered questions. Reviewed medications, SDOH and signs of stroke.   Follow up plan: No further intervention required.   Alan Ee, RN, BSN, CEN [employer]- Transition of Care Team.  [facility] [phone]

## 2023-12-11 NOTE — Progress Notes (Signed)
 Ordering 30 day monitor, per neurology. Post stroke. Dr. Floretta to read.

## 2023-12-14 ENCOUNTER — Other Ambulatory Visit (HOSPITAL_COMMUNITY): Payer: Self-pay

## 2023-12-14 ENCOUNTER — Other Ambulatory Visit: Payer: Self-pay

## 2023-12-14 MED ORDER — HYDROCODONE-ACETAMINOPHEN 10-325 MG PO TABS
1.0000 | ORAL_TABLET | Freq: Two times a day (BID) | ORAL | 0 refills | Status: DC
  Filled 2023-12-14: qty 60, 30d supply, fill #0

## 2023-12-15 ENCOUNTER — Other Ambulatory Visit (HOSPITAL_COMMUNITY): Payer: Self-pay

## 2023-12-18 ENCOUNTER — Other Ambulatory Visit (HOSPITAL_COMMUNITY): Payer: Self-pay

## 2024-01-08 ENCOUNTER — Other Ambulatory Visit (HOSPITAL_COMMUNITY): Payer: Self-pay

## 2024-01-08 MED ORDER — HYDROCODONE-ACETAMINOPHEN 10-325 MG PO TABS
1.0000 | ORAL_TABLET | Freq: Two times a day (BID) | ORAL | 0 refills | Status: DC
  Filled 2024-01-08 – 2024-01-12 (×4): qty 60, 30d supply, fill #0

## 2024-01-08 MED ORDER — DOXYCYCLINE HYCLATE 100 MG PO CAPS
100.0000 mg | ORAL_CAPSULE | Freq: Two times a day (BID) | ORAL | 0 refills | Status: AC
  Filled 2024-01-08: qty 14, 7d supply, fill #0

## 2024-01-10 ENCOUNTER — Other Ambulatory Visit (HOSPITAL_COMMUNITY): Payer: Self-pay

## 2024-01-11 ENCOUNTER — Other Ambulatory Visit (HOSPITAL_COMMUNITY): Payer: Self-pay

## 2024-01-12 ENCOUNTER — Other Ambulatory Visit (HOSPITAL_COMMUNITY): Payer: Self-pay

## 2024-01-14 ENCOUNTER — Other Ambulatory Visit (HOSPITAL_COMMUNITY): Payer: Self-pay

## 2024-01-15 ENCOUNTER — Other Ambulatory Visit (HOSPITAL_COMMUNITY): Payer: Self-pay

## 2024-01-15 MED ORDER — DICYCLOMINE HCL 10 MG PO CAPS
10.0000 mg | ORAL_CAPSULE | Freq: Four times a day (QID) | ORAL | 5 refills | Status: AC
  Filled 2024-01-15: qty 120, 30d supply, fill #0

## 2024-01-15 MED ORDER — MONTELUKAST SODIUM 10 MG PO TABS
10.0000 mg | ORAL_TABLET | Freq: Every day | ORAL | 1 refills | Status: AC
  Filled 2024-01-15: qty 90, 90d supply, fill #0

## 2024-01-16 ENCOUNTER — Ambulatory Visit: Attending: Student in an Organized Health Care Education/Training Program

## 2024-01-16 DIAGNOSIS — I639 Cerebral infarction, unspecified: Secondary | ICD-10-CM

## 2024-01-16 NOTE — Progress Notes (Signed)
 "  Patient: Anthony Newman Date of Birth: [birth year]  Reason for Visit: Stroke Clinic Follow Up History from: Patient Primary Neurologist: Rosemarie   ASSESSMENT AND PLAN 69 y.o. 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  Given TNK. Presented with right leg weakness, resolved with TNK. Vascular risk factors: HTN, HLD, tobacco abuse.  - Completed cardiac monitor, but stopped a few days early, report is pending. If unremarkable, consider cardiology evaluation for loop recorder. CVA concerning for cardio embolic etiology.  - Will continue home regimen of aspirin  81 mg and plavix  75 mg, completed 4 weeks DAPT aspirin  + Brilinta  - Excellent job on smoking cessation! - Strict management of vascular risk factors with a goal BP less than 130/90, A1c less than 7.0, LDL less than 70 for secondary stroke prevention - Continue follow up with primary care and cardiology. Goes to [facility]. Is scheduled to see [facility] Cardiology Dr. Floretta end of this month  - Follow up in 6 months with me  HISTORY OF PRESENT ILLNESS: Today 01/17/24 Here today alone, finished cardiac monitor, but stopped 5 days early because he ran out stickers. Is finished with aspirin  and brilinta , back on aspirin  and plavix . Leading up to recent CVA event, had missed some days of DAPT due to colonoscopy/dental work in November. Goes to [facility] for care, cardiology. Right sided weakness resolved after TNK. Feels back to normal, except feels a little jittery, nervous. LDL elevated 103, Lipitor  80 mg, he had stopped it prior. BP 136/78 today, normally runs in the 115/70's. Lives with his sister, drives. He is retired. He likes to play golf. Stopped smoking cigarettes. No PT or OT needed.  HISTORY  Admitted 12/06/2023 with acute onset right lower extremity numbness and weakness.  He had some chest pain at onset that improved with aspirin  and nitro.  CT head negative, TNK given.   NIH 2.  MRI showed left ACA stroke.  -Code stroke CT head no acute abnormality - CTA head and neck no LVO or significant stenosis - Repeat CT head 24 hours post TNK no hemorrhage - MRI of the brain small acute distal left ACA infarcts, mild chronic small vessel disease.  Small chronic right cerebellar infarct - 2D echo EF 50 to 55%, mild left LVH, apical hypokinesis, no apical thrombus - Recommended 30-day cardiac monitoring outpatient - LDL 103 - A1c 6.0 - Aspirin  81 mg daily and Plavix  75 daily prior to admission, aspirin  81 and Brilinta  90 mg twice daily for 4 weeks, then back to home aspirin  and Plavix   REVIEW OF SYSTEMS: Out of a complete 14 system review of symptoms, the patient complains only of the following symptoms, and all other reviewed systems are negative.  See HPI  ALLERGIES: Allergies[1]  HOME MEDICATIONS: Outpatient Medications Prior to Visit  Medication Sig Dispense Refill   albuterol  (VENTOLIN  HFA) 108 (90 Base) MCG/ACT inhaler Inhale 2 puffs into the lungs every 6 (six) hours as needed. 6.7 g 6   aspirin  EC 81 MG tablet Take 1 tablet (81 mg total) by mouth daily. Swallow whole. 30 tablet 12   atorvastatin  (LIPITOR ) 80 MG tablet Take 1 tablet (80 mg total) by mouth daily. 90 tablet 1   baclofen  (LIORESAL ) 10 MG tablet Take 1 tablet (10 mg total) by mouth every 8 (eight) hours as needed for muscle spasm. 60 tablet 3   carvedilol  (COREG ) 6.25 MG tablet Take 1 tablet (6.25 mg total) by mouth 2 (two) times  daily. 180 tablet 1   clopidogrel  (PLAVIX ) 75 MG tablet Take 1 tablet (75 mg total) by mouth daily. 90 tablet 0   dicyclomine  (BENTYL ) 10 MG capsule Take 1 Capsule (10 mg) three times daily before meals and a bedtime 120 capsule 5   doxycycline  (VIBRAMYCIN ) 100 MG capsule Take 1 capsule (100 mg total) by mouth 2 (two) times daily. 14 capsule 0   famotidine  (PEPCID ) 20 MG tablet Take 1 tablet (20 mg total) by mouth 2 (two) times daily. 180 tablet 0   gabapentin   (NEURONTIN ) 300 MG capsule Take 1 capsule (300 mg total) by mouth 3 (three) times daily for chronic back and neck pain. (Patient taking differently: Take 300 mg by mouth 2 (two) times daily.) 90 capsule 2   GI Cocktail (alum & mag hydroxide-simethicone /lidocaine )oral mixture Take 15 mL (1 tablespoonful) every 6 hours as needed for acid reflux pain. 360 mL 5   HYDROcodone -acetaminophen  (NORCO) 10-325 MG tablet Take 1 tablet by mouth 2 (two) times daily. Max 2 tablets per day 60 tablet 0   losartan  (COZAAR ) 25 MG tablet Take 1 tablet (25 mg total) by mouth daily. Discontinue quinapril  90 tablet 1   montelukast  (SINGULAIR ) 10 MG tablet Take 1 tablet (10 mg total) by mouth daily for chronic allergies 90 tablet 1   nicotine  (NICODERM CQ  - DOSED IN MG/24 HR) 7 mg/24hr patch Place 1 patch (7 mg total) onto the skin daily. 28 patch 0   nitroGLYCERIN  (NITROSTAT ) 0.4 MG SL tablet Place 0.4 mg under the tongue every 5 (five) minutes as needed for chest pain.     sertraline  (ZOLOFT ) 50 MG tablet Take 1 tablet (50 mg total) by mouth daily. 90 tablet 1   Vitamin D , Ergocalciferol , (DRISDOL ) 1.25 MG (50000 UNIT) CAPS capsule Take 1 capsule (50,000 Units total) by mouth once a week. 5 capsule 2   No facility-administered medications prior to visit.    PAST MEDICAL HISTORY: Past Medical History:  Diagnosis Date   Coronary artery disease     PAST SURGICAL HISTORY: Past Surgical History:  Procedure Laterality Date   LAPAROSCOPIC APPENDECTOMY N/A 06/05/2019   Procedure: APPENDECTOMY LAPAROSCOPIC;  Surgeon: Kinsinger, Herlene Righter, MD;  Location: [facility] OR;  Service: General;  Laterality: N/A;    FAMILY HISTORY: History reviewed. No pertinent family history.  SOCIAL HISTORY: Social History   Socioeconomic History   Marital status: Single    Spouse name: Not on file   Number of children: Not on file   Years of education: Not on file   Highest education level: Not on file  Occupational History   Not on file   Tobacco Use   Smoking status: Every Day   Smokeless tobacco: Not on file  Substance and Sexual Activity   Alcohol use: No   Drug use: No   Sexual activity: Not on file  Other Topics Concern   Not on file  Social History Narrative   Not on file   Social Drivers of Health   Tobacco Use: High Risk (01/17/2024)   Patient History    Smoking Tobacco Use: Every Day    Smokeless Tobacco Use: Unknown    Passive Exposure: Not on file  Financial Resource Strain: Not on file  Food Insecurity: No Food Insecurity (12/11/2023)   Epic    Worried About Programme Researcher, Broadcasting/film/video in the Last Year: Never true    Ran Out of Food in the Last Year: Never true  Transportation Needs: No Transportation Needs (12/11/2023)  Epic    Lack of Transportation (Medical): No    Lack of Transportation (Non-Medical): No  Physical Activity: Not on file  Stress: Not on file  Social Connections: Unknown (12/07/2023)   Social Connection and Isolation Panel    Frequency of Communication with Friends and Family: Never    Frequency of Social Gatherings with Friends and Family: Not on file    Attends Religious Services: Not on file    Active Member of Clubs or Organizations: Not on file    Attends Banker Meetings: Not on file    Marital Status: Not on file  Intimate Partner Violence: Unknown (12/11/2023)   Epic    Fear of Current or Ex-Partner: No    Emotionally Abused: No    Physically Abused: No    Sexually Abused: Not on file  Depression (EYV7-0): Not on file  Alcohol Screen: Not on file  Housing: Unknown (12/11/2023)   Epic    Unable to Pay for Housing in the Last Year: No    Number of Times Moved in the Last Year: Not on file    Homeless in the Last Year: No  Utilities: Not At Risk (12/11/2023)   Epic    Threatened with loss of utilities: No  Health Literacy: Not on file    PHYSICAL EXAM  Vitals:   01/17/24 0827  BP: 136/78  Pulse: 74  SpO2: 98%  Weight: 161 lb 8 oz (73.3 kg)  Height: 5'  9 (1.753 m)   Body mass index is 23.85 kg/m.  Generalized: Well developed, in no acute distress  Neurological examination  Mentation: Alert oriented to time, place, history taking. Follows all commands speech and language fluent Cranial nerve II-XII: Pupils were equal round reactive to light. Extraocular movements were full, visual field were full on confrontational test. Facial sensation and strength were normal. . Head turning and shoulder shrug  were normal and symmetric. Motor: The motor testing reveals 5 over 5 strength of all 4 extremities. Good symmetric motor tone is noted throughout.  Sensory: Sensory testing is intact to soft touch on all 4 extremities. No evidence of extinction is noted.  Coordination: Cerebellar testing reveals good finger-nose-finger and heel-to-shin bilaterally.  Mild tremor in the hands noted. Gait and station: Gait is normal. Tandem gait is normal.  Reflexes: Deep tendon reflexes are symmetric and normal bilaterally.   DIAGNOSTIC DATA (LABS, IMAGING, TESTING) - I reviewed patient records, labs, notes, testing and imaging myself where available.  Lab Results  Component Value Date   WBC 7.6 12/08/2023   HGB 14.0 12/08/2023   HCT 40.9 12/08/2023   MCV 89.3 12/08/2023   PLT 214 12/08/2023      Component Value Date/Time   NA 135 12/08/2023 0434   K 3.4 (L) 12/08/2023 0434   CL 101 12/08/2023 0434   CO2 24 12/08/2023 0434   GLUCOSE 151 (H) 12/08/2023 0434   BUN 8 12/08/2023 0434   CREATININE 0.92 12/08/2023 0434   CALCIUM  8.6 (L) 12/08/2023 0434   PROT 7.1 12/06/2023 2109   ALBUMIN 3.7 12/06/2023 2109   AST 19 12/06/2023 2109   ALT 18 12/06/2023 2109   ALKPHOS 90 12/06/2023 2109   BILITOT 0.7 12/06/2023 2109   GFRNONAA >60 12/08/2023 0434   GFRAA >60 06/05/2019 2301   Lab Results  Component Value Date   CHOL 156 12/07/2023   HDL 40 (L) 12/07/2023   LDLCALC 103 (H) 12/07/2023   TRIG 64 12/07/2023   CHOLHDL 3.9 12/07/2023  Lab Results   Component Value Date   HGBA1C 6.0 (H) 12/07/2023   No results found for: VITAMINB12 No results found for: TSH  Lauraine Born, AGNP-C, DNP 01/17/2024, 9:33 AM [facility] [address] [place], [place] [address] [phone]    [1]  Allergies Allergen Reactions   Bee Venom Anaphylaxis   Ketorolac  Nausea And Vomiting   "

## 2024-01-17 ENCOUNTER — Encounter: Payer: Self-pay | Admitting: Neurology

## 2024-01-17 ENCOUNTER — Ambulatory Visit: Admitting: Neurology

## 2024-01-17 VITALS — BP 136/78 | HR 74 | Ht 69.0 in | Wt 161.5 lb

## 2024-01-17 DIAGNOSIS — I639 Cerebral infarction, unspecified: Secondary | ICD-10-CM

## 2024-01-17 DIAGNOSIS — E785 Hyperlipidemia, unspecified: Secondary | ICD-10-CM

## 2024-01-17 NOTE — Patient Instructions (Addendum)
 Continue current aspirin  and plavix  for secondary stroke prevention per cardiology  Strict management of vascular risk factors with a goal BP less than 130/90, A1c less than 7.0, LDL less than 70 for secondary stroke prevention Recommend follow up with cardiology consider loop recorder  Keep appointment with Cardiology Dr. Floretta end of the month

## 2024-01-17 NOTE — Progress Notes (Signed)
 I agree with the above plan

## 2024-01-19 ENCOUNTER — Other Ambulatory Visit (HOSPITAL_COMMUNITY): Payer: Self-pay

## 2024-01-24 ENCOUNTER — Other Ambulatory Visit (HOSPITAL_COMMUNITY): Payer: Self-pay

## 2024-01-26 ENCOUNTER — Other Ambulatory Visit (HOSPITAL_COMMUNITY): Payer: Self-pay

## 2024-01-31 ENCOUNTER — Other Ambulatory Visit (HOSPITAL_COMMUNITY): Payer: Self-pay

## 2024-01-31 ENCOUNTER — Other Ambulatory Visit: Payer: Self-pay

## 2024-01-31 ENCOUNTER — Ambulatory Visit: Admitting: Student in an Organized Health Care Education/Training Program

## 2024-01-31 MED ORDER — GABAPENTIN 300 MG PO CAPS
300.0000 mg | ORAL_CAPSULE | Freq: Three times a day (TID) | ORAL | 2 refills | Status: AC
  Filled 2024-01-31: qty 90, 30d supply, fill #0

## 2024-02-08 ENCOUNTER — Other Ambulatory Visit (HOSPITAL_COMMUNITY): Payer: Self-pay

## 2024-02-08 MED ORDER — LOSARTAN POTASSIUM 25 MG PO TABS
25.0000 mg | ORAL_TABLET | Freq: Every day | ORAL | 1 refills | Status: AC
  Filled 2024-02-08: qty 90, 90d supply, fill #0

## 2024-02-08 MED ORDER — CARVEDILOL 6.25 MG PO TABS
6.2500 mg | ORAL_TABLET | Freq: Two times a day (BID) | ORAL | 1 refills | Status: AC
  Filled 2024-02-08: qty 180, 90d supply, fill #0

## 2024-02-08 MED ORDER — HYDROCODONE-ACETAMINOPHEN 10-325 MG PO TABS
1.0000 | ORAL_TABLET | Freq: Two times a day (BID) | ORAL | 0 refills | Status: AC
  Filled ????-??-??: fill #0

## 2024-02-12 ENCOUNTER — Ambulatory Visit: Admitting: Student in an Organized Health Care Education/Training Program

## 2024-07-30 ENCOUNTER — Ambulatory Visit: Admitting: Neurology
